# Patient Record
Sex: Female | Born: 1940 | Race: White | Hispanic: No | Marital: Married | Smoking: Current every day smoker
Health system: Southern US, Community
[De-identification: ages and names within clinical notes are randomized; demographics above are authoritative.]

## PROBLEM LIST (undated history)

## (undated) DIAGNOSIS — Z8673 Personal history of transient ischemic attack (TIA), and cerebral infarction without residual deficits: Secondary | ICD-10-CM

## (undated) DIAGNOSIS — I714 Abdominal aortic aneurysm, without rupture, unspecified: Secondary | ICD-10-CM

## (undated) DIAGNOSIS — J449 Chronic obstructive pulmonary disease, unspecified: Secondary | ICD-10-CM

## (undated) DIAGNOSIS — I639 Cerebral infarction, unspecified: Secondary | ICD-10-CM

## (undated) DIAGNOSIS — C50911 Malignant neoplasm of unspecified site of right female breast: Secondary | ICD-10-CM

## (undated) DIAGNOSIS — R350 Frequency of micturition: Secondary | ICD-10-CM

## (undated) DIAGNOSIS — R2689 Other abnormalities of gait and mobility: Secondary | ICD-10-CM

## (undated) DIAGNOSIS — R319 Hematuria, unspecified: Secondary | ICD-10-CM

## (undated) DIAGNOSIS — K08109 Complete loss of teeth, unspecified cause, unspecified class: Secondary | ICD-10-CM

## (undated) DIAGNOSIS — Z923 Personal history of irradiation: Secondary | ICD-10-CM

## (undated) DIAGNOSIS — Z170 Estrogen receptor positive status [ER+]: Secondary | ICD-10-CM

## (undated) DIAGNOSIS — R0600 Dyspnea, unspecified: Secondary | ICD-10-CM

## (undated) DIAGNOSIS — D494 Neoplasm of unspecified behavior of bladder: Secondary | ICD-10-CM

## (undated) DIAGNOSIS — Z973 Presence of spectacles and contact lenses: Secondary | ICD-10-CM

## (undated) DIAGNOSIS — I10 Essential (primary) hypertension: Secondary | ICD-10-CM

## (undated) DIAGNOSIS — Z972 Presence of dental prosthetic device (complete) (partial): Secondary | ICD-10-CM

## (undated) DIAGNOSIS — E785 Hyperlipidemia, unspecified: Secondary | ICD-10-CM

## (undated) DIAGNOSIS — J432 Centrilobular emphysema: Secondary | ICD-10-CM

## (undated) HISTORY — PX: ABDOMINAL HYSTERECTOMY: SHX81

## (undated) HISTORY — PX: SHOULDER OPEN ROTATOR CUFF REPAIR: SHX2407

## (undated) HISTORY — PX: APPENDECTOMY: SHX54

## (undated) HISTORY — PX: TONSILLECTOMY: SUR1361

## (undated) HISTORY — DX: Chronic obstructive pulmonary disease, unspecified: J44.9

## (undated) HISTORY — PX: BREAST LUMPECTOMY WITH NEEDLE LOCALIZATION AND AXILLARY LYMPH NODE DISSECTION: SHX5758

---

## 2002-01-24 ENCOUNTER — Emergency Department (HOSPITAL_COMMUNITY): Admission: EM | Admit: 2002-01-24 | Discharge: 2002-01-24 | Payer: Self-pay | Admitting: Emergency Medicine

## 2004-05-23 ENCOUNTER — Emergency Department (HOSPITAL_COMMUNITY): Admission: EM | Admit: 2004-05-23 | Discharge: 2004-05-23 | Payer: Self-pay | Admitting: *Deleted

## 2005-08-12 ENCOUNTER — Encounter: Admission: RE | Admit: 2005-08-12 | Discharge: 2005-08-12 | Payer: Self-pay | Admitting: Internal Medicine

## 2005-08-16 ENCOUNTER — Encounter: Admission: RE | Admit: 2005-08-16 | Discharge: 2005-08-16 | Payer: Self-pay | Admitting: Internal Medicine

## 2006-04-05 HISTORY — PX: BREAST LUMPECTOMY: SHX2

## 2006-08-25 ENCOUNTER — Encounter: Admission: RE | Admit: 2006-08-25 | Discharge: 2006-08-25 | Payer: Self-pay | Admitting: Internal Medicine

## 2006-09-08 ENCOUNTER — Encounter (INDEPENDENT_AMBULATORY_CARE_PROVIDER_SITE_OTHER): Payer: Self-pay | Admitting: Diagnostic Radiology

## 2006-09-08 ENCOUNTER — Encounter: Admission: RE | Admit: 2006-09-08 | Discharge: 2006-09-08 | Payer: Self-pay | Admitting: Internal Medicine

## 2006-09-27 ENCOUNTER — Encounter: Admission: RE | Admit: 2006-09-27 | Discharge: 2006-09-27 | Payer: Self-pay | Admitting: Internal Medicine

## 2006-10-11 ENCOUNTER — Encounter: Admission: RE | Admit: 2006-10-11 | Discharge: 2006-10-11 | Payer: Self-pay | Admitting: Surgery

## 2006-10-12 ENCOUNTER — Encounter: Admission: RE | Admit: 2006-10-12 | Discharge: 2006-10-12 | Payer: Self-pay | Admitting: Surgery

## 2006-10-13 ENCOUNTER — Ambulatory Visit (HOSPITAL_BASED_OUTPATIENT_CLINIC_OR_DEPARTMENT_OTHER): Admission: RE | Admit: 2006-10-13 | Discharge: 2006-10-13 | Payer: Self-pay | Admitting: Surgery

## 2006-10-13 ENCOUNTER — Encounter (INDEPENDENT_AMBULATORY_CARE_PROVIDER_SITE_OTHER): Payer: Self-pay | Admitting: Surgery

## 2006-10-13 ENCOUNTER — Encounter: Admission: RE | Admit: 2006-10-13 | Discharge: 2006-10-13 | Payer: Self-pay | Admitting: Surgery

## 2006-10-24 ENCOUNTER — Ambulatory Visit: Payer: Self-pay | Admitting: Oncology

## 2006-11-09 LAB — CBC WITH DIFFERENTIAL/PLATELET
BASO%: 0.5 % (ref 0.0–2.0)
HCT: 41.8 % (ref 34.8–46.6)
MCHC: 35.2 g/dL (ref 32.0–36.0)
MONO#: 0.8 10*3/uL (ref 0.1–0.9)
RBC: 4.67 10*6/uL (ref 3.70–5.32)
RDW: 13.3 % (ref 11.3–14.5)
WBC: 10.4 10*3/uL — ABNORMAL HIGH (ref 3.9–10.0)
lymph#: 2.9 10*3/uL (ref 0.9–3.3)

## 2006-11-13 LAB — COMPREHENSIVE METABOLIC PANEL
ALT: 28 U/L (ref 0–35)
AST: 18 U/L (ref 0–37)
Albumin: 4.2 g/dL (ref 3.5–5.2)
Calcium: 9.6 mg/dL (ref 8.4–10.5)
Chloride: 101 mEq/L (ref 96–112)
Creatinine, Ser: 0.73 mg/dL (ref 0.40–1.20)
Potassium: 3.8 mEq/L (ref 3.5–5.3)
Sodium: 140 mEq/L (ref 135–145)
Total Protein: 6.9 g/dL (ref 6.0–8.3)

## 2006-11-13 LAB — VITAMIN D PNL(25-HYDRXY+1,25-DIHY)-BLD: Vit D, 25-Hydroxy: 19 ng/mL — ABNORMAL LOW (ref 20–57)

## 2006-11-17 ENCOUNTER — Ambulatory Visit: Admission: RE | Admit: 2006-11-17 | Discharge: 2007-02-15 | Payer: Self-pay | Admitting: *Deleted

## 2006-12-30 ENCOUNTER — Encounter: Admission: RE | Admit: 2006-12-30 | Discharge: 2006-12-30 | Payer: Self-pay | Admitting: Oncology

## 2007-01-02 ENCOUNTER — Ambulatory Visit (HOSPITAL_COMMUNITY): Admission: RE | Admit: 2007-01-02 | Discharge: 2007-01-02 | Payer: Self-pay | Admitting: Oncology

## 2007-01-09 ENCOUNTER — Ambulatory Visit: Payer: Self-pay | Admitting: Oncology

## 2007-01-09 LAB — LACTATE DEHYDROGENASE: LDH: 134 U/L (ref 94–250)

## 2007-01-09 LAB — CBC WITH DIFFERENTIAL/PLATELET
Basophils Absolute: 0 10*3/uL (ref 0.0–0.1)
EOS%: 3.1 % (ref 0.0–7.0)
HCT: 40.8 % (ref 34.8–46.6)
HGB: 14.4 g/dL (ref 11.6–15.9)
MCH: 31.5 pg (ref 26.0–34.0)
NEUT%: 65.8 % (ref 39.6–76.8)
lymph#: 2.1 10*3/uL (ref 0.9–3.3)

## 2007-01-09 LAB — COMPREHENSIVE METABOLIC PANEL
ALT: 16 U/L (ref 0–35)
CO2: 28 mEq/L (ref 19–32)
Calcium: 9.7 mg/dL (ref 8.4–10.5)
Chloride: 101 mEq/L (ref 96–112)
Glucose, Bld: 117 mg/dL — ABNORMAL HIGH (ref 70–99)
Sodium: 140 mEq/L (ref 135–145)
Total Bilirubin: 0.4 mg/dL (ref 0.3–1.2)
Total Protein: 7 g/dL (ref 6.0–8.3)

## 2007-02-15 ENCOUNTER — Encounter: Admission: RE | Admit: 2007-02-15 | Discharge: 2007-02-15 | Payer: Self-pay | Admitting: Internal Medicine

## 2007-02-28 ENCOUNTER — Ambulatory Visit: Payer: Self-pay | Admitting: Oncology

## 2007-03-06 LAB — CANCER ANTIGEN 27.29: CA 27.29: 15 U/mL (ref 0–39)

## 2007-03-06 LAB — CBC WITH DIFFERENTIAL/PLATELET
BASO%: 1.5 % (ref 0.0–2.0)
EOS%: 2.4 % (ref 0.0–7.0)
HCT: 41.5 % (ref 34.8–46.6)
MCH: 31.6 pg (ref 26.0–34.0)
MCHC: 35.3 g/dL (ref 32.0–36.0)
MONO#: 0.7 10*3/uL (ref 0.1–0.9)
RBC: 4.63 10*6/uL (ref 3.70–5.32)
RDW: 13.1 % (ref 11.3–14.5)
WBC: 10.2 10*3/uL — ABNORMAL HIGH (ref 3.9–10.0)
lymph#: 2.3 10*3/uL (ref 0.9–3.3)

## 2007-03-06 LAB — COMPREHENSIVE METABOLIC PANEL
ALT: 38 U/L — ABNORMAL HIGH (ref 0–35)
AST: 21 U/L (ref 0–37)
CO2: 27 mEq/L (ref 19–32)
Calcium: 10.3 mg/dL (ref 8.4–10.5)
Chloride: 98 mEq/L (ref 96–112)
Potassium: 3.9 mEq/L (ref 3.5–5.3)
Sodium: 140 mEq/L (ref 135–145)
Total Protein: 7.5 g/dL (ref 6.0–8.3)

## 2007-03-09 ENCOUNTER — Ambulatory Visit (HOSPITAL_COMMUNITY): Admission: RE | Admit: 2007-03-09 | Discharge: 2007-03-09 | Payer: Self-pay | Admitting: Oncology

## 2007-04-03 ENCOUNTER — Ambulatory Visit (HOSPITAL_COMMUNITY): Admission: RE | Admit: 2007-04-03 | Discharge: 2007-04-03 | Payer: Self-pay | Admitting: Internal Medicine

## 2007-08-29 ENCOUNTER — Encounter: Admission: RE | Admit: 2007-08-29 | Discharge: 2007-08-29 | Payer: Self-pay | Admitting: Internal Medicine

## 2007-08-31 ENCOUNTER — Ambulatory Visit: Payer: Self-pay | Admitting: Oncology

## 2007-09-04 LAB — CBC WITH DIFFERENTIAL/PLATELET
Basophils Absolute: 0.1 10*3/uL (ref 0.0–0.1)
Eosinophils Absolute: 0.1 10*3/uL (ref 0.0–0.5)
HCT: 44.1 % (ref 34.8–46.6)
HGB: 15.4 g/dL (ref 11.6–15.9)
MCV: 89.3 fL (ref 81.0–101.0)
MONO%: 6.1 % (ref 0.0–13.0)
NEUT#: 6.2 10*3/uL (ref 1.5–6.5)
Platelets: 331 10*3/uL (ref 145–400)
RDW: 13 % (ref 11.3–14.5)

## 2007-09-05 LAB — COMPREHENSIVE METABOLIC PANEL
Albumin: 4.4 g/dL (ref 3.5–5.2)
Alkaline Phosphatase: 66 U/L (ref 39–117)
BUN: 16 mg/dL (ref 6–23)
Calcium: 10.1 mg/dL (ref 8.4–10.5)
Glucose, Bld: 120 mg/dL — ABNORMAL HIGH (ref 70–99)
Potassium: 5 mEq/L (ref 3.5–5.3)

## 2007-09-05 LAB — VITAMIN D 25 HYDROXY (VIT D DEFICIENCY, FRACTURES): Vit D, 25-Hydroxy: 30 ng/mL (ref 30–89)

## 2008-02-28 ENCOUNTER — Ambulatory Visit: Payer: Self-pay | Admitting: Oncology

## 2008-03-05 LAB — CBC WITH DIFFERENTIAL/PLATELET
Basophils Absolute: 0.1 10*3/uL (ref 0.0–0.1)
Eosinophils Absolute: 0.2 10*3/uL (ref 0.0–0.5)
HCT: 39.8 % (ref 34.8–46.6)
HGB: 13.7 g/dL (ref 11.6–15.9)
MONO#: 0.6 10*3/uL (ref 0.1–0.9)
NEUT#: 5.6 10*3/uL (ref 1.5–6.5)
NEUT%: 63.3 % (ref 39.6–76.8)
RDW: 13.6 % (ref 11.3–14.5)
lymph#: 2.4 10*3/uL (ref 0.9–3.3)

## 2008-03-06 LAB — COMPREHENSIVE METABOLIC PANEL
AST: 18 U/L (ref 0–37)
Albumin: 4.2 g/dL (ref 3.5–5.2)
BUN: 16 mg/dL (ref 6–23)
CO2: 27 mEq/L (ref 19–32)
Calcium: 8.9 mg/dL (ref 8.4–10.5)
Chloride: 101 mEq/L (ref 96–112)
Creatinine, Ser: 0.6 mg/dL (ref 0.40–1.20)
Glucose, Bld: 136 mg/dL — ABNORMAL HIGH (ref 70–99)
Potassium: 3.3 mEq/L — ABNORMAL LOW (ref 3.5–5.3)

## 2008-03-06 LAB — CANCER ANTIGEN 27.29: CA 27.29: 14 U/mL (ref 0–39)

## 2008-08-23 ENCOUNTER — Ambulatory Visit: Payer: Self-pay | Admitting: Oncology

## 2008-08-27 LAB — COMPREHENSIVE METABOLIC PANEL
AST: 17 U/L (ref 0–37)
Albumin: 4.1 g/dL (ref 3.5–5.2)
Alkaline Phosphatase: 54 U/L (ref 39–117)
BUN: 12 mg/dL (ref 6–23)
Calcium: 10 mg/dL (ref 8.4–10.5)
Chloride: 99 mEq/L (ref 96–112)
Creatinine, Ser: 0.79 mg/dL (ref 0.40–1.20)
Glucose, Bld: 126 mg/dL — ABNORMAL HIGH (ref 70–99)
Potassium: 3.6 mEq/L (ref 3.5–5.3)

## 2008-08-27 LAB — CBC WITH DIFFERENTIAL/PLATELET
Basophils Absolute: 0.1 10*3/uL (ref 0.0–0.1)
EOS%: 1.3 % (ref 0.0–7.0)
Eosinophils Absolute: 0.1 10*3/uL (ref 0.0–0.5)
HCT: 44.3 % (ref 34.8–46.6)
HGB: 15.2 g/dL (ref 11.6–15.9)
MCH: 31.2 pg (ref 25.1–34.0)
MCV: 91.1 fL (ref 79.5–101.0)
MONO%: 5.3 % (ref 0.0–14.0)
NEUT#: 5.9 10*3/uL (ref 1.5–6.5)
NEUT%: 63.8 % (ref 38.4–76.8)
RDW: 13.5 % (ref 11.2–14.5)

## 2008-08-29 ENCOUNTER — Encounter: Admission: RE | Admit: 2008-08-29 | Discharge: 2008-08-29 | Payer: Self-pay | Admitting: Internal Medicine

## 2009-01-10 ENCOUNTER — Ambulatory Visit (HOSPITAL_COMMUNITY): Admission: RE | Admit: 2009-01-10 | Discharge: 2009-01-10 | Payer: Self-pay | Admitting: Oncology

## 2009-01-23 ENCOUNTER — Ambulatory Visit: Payer: Self-pay | Admitting: Oncology

## 2009-01-27 LAB — CBC WITH DIFFERENTIAL/PLATELET
BASO%: 0.3 % (ref 0.0–2.0)
EOS%: 1.7 % (ref 0.0–7.0)
HGB: 15.2 g/dL (ref 11.6–15.9)
LYMPH%: 28.8 % (ref 14.0–49.7)
MCH: 31.6 pg (ref 25.1–34.0)
MCHC: 33.9 g/dL (ref 31.5–36.0)
MCV: 93.2 fL (ref 79.5–101.0)
MONO%: 6.8 % (ref 0.0–14.0)
NEUT%: 62.4 % (ref 38.4–76.8)
RBC: 4.82 10*6/uL (ref 3.70–5.45)
WBC: 11.3 10*3/uL — ABNORMAL HIGH (ref 3.9–10.3)
lymph#: 3.2 10*3/uL (ref 0.9–3.3)

## 2009-01-27 LAB — COMPREHENSIVE METABOLIC PANEL
ALT: 21 U/L (ref 0–35)
Alkaline Phosphatase: 58 U/L (ref 39–117)
BUN: 12 mg/dL (ref 6–23)
Calcium: 9.8 mg/dL (ref 8.4–10.5)
Potassium: 4.2 mEq/L (ref 3.5–5.3)
Total Protein: 7.1 g/dL (ref 6.0–8.3)

## 2009-09-02 ENCOUNTER — Encounter: Admission: RE | Admit: 2009-09-02 | Discharge: 2009-09-02 | Payer: Self-pay | Admitting: Internal Medicine

## 2010-01-26 ENCOUNTER — Ambulatory Visit: Payer: Self-pay | Admitting: Oncology

## 2010-01-28 LAB — COMPREHENSIVE METABOLIC PANEL
Albumin: 3.8 g/dL (ref 3.5–5.2)
BUN: 12 mg/dL (ref 6–23)
Chloride: 100 mEq/L (ref 96–112)
Glucose, Bld: 102 mg/dL — ABNORMAL HIGH (ref 70–99)
Total Bilirubin: 0.5 mg/dL (ref 0.3–1.2)

## 2010-01-28 LAB — CBC WITH DIFFERENTIAL/PLATELET
BASO%: 0.7 % (ref 0.0–2.0)
Basophils Absolute: 0.1 10*3/uL (ref 0.0–0.1)
EOS%: 2 % (ref 0.0–7.0)
Eosinophils Absolute: 0.2 10*3/uL (ref 0.0–0.5)
HCT: 42.7 % (ref 34.8–46.6)
MCHC: 34.4 g/dL (ref 31.5–36.0)
MONO%: 5.5 % (ref 0.0–14.0)
NEUT#: 6.2 10*3/uL (ref 1.5–6.5)
RBC: 4.63 10*6/uL (ref 3.70–5.45)
WBC: 11 10*3/uL — ABNORMAL HIGH (ref 3.9–10.3)

## 2010-02-12 ENCOUNTER — Encounter: Admission: RE | Admit: 2010-02-12 | Discharge: 2010-02-12 | Payer: Self-pay | Admitting: Oncology

## 2010-04-25 ENCOUNTER — Other Ambulatory Visit: Payer: Self-pay | Admitting: Oncology

## 2010-04-25 DIAGNOSIS — Z9889 Other specified postprocedural states: Secondary | ICD-10-CM

## 2010-08-18 NOTE — Op Note (Signed)
NAMEANITHA, KREISER         ACCOUNT NO.:  [id number]   MEDICAL RECORD NO.:  [id number]          PATIENT TYPE:  OUT   LOCATION:  NUC                          FACILITY:  [facility]   PHYSICIAN:  Maisie Fus A. Cornett, M.D.DATE OF BIRTH:  07/19/1940   DATE OF PROCEDURE:  10/13/2006  DATE OF DISCHARGE:                               OPERATIVE REPORT   PREOPERATIVE DIAGNOSIS:  Right breast cancer.   POSTOPERATIVE DIAGNOSIS:  Right breast cancer.   PROCEDURE:  1. Right breast needle-localized lumpectomy.  2. Right sentinel lymph node mapping with methylene blue dye.   SURGEON:  Harriette Bouillon, M.D.   ASSISTANT:  OR staff.   DRAINS:  None.   SPECIMEN:  1. Right breast mass with localizing wire to pathology.  2. 3 sentinel lymph nodes from the right axilla negative by touch      prep.   INDICATIONS FOR PROCEDURE:  The patient is a 70 year old postmenopausal  female who was found to have a 1 cm mass in the right upper outer  quadrant of her breast.  Core biopsies consistent with breast cancer.  She presents today for breast conservation surgery since she would like  to conserve her breast.   DESCRIPTION OF PROCEDURE:  After undergoing right breast needle  localization by the radiologist as well as injection of technetium  sulfur colloid in the right subareolar tissue of her breast.  She was  taken back to the operating room.  After sterile prep and drape, 4 mL of  methylene blue dye were injected in a subareolar position in the right  nipple.  Massage for five minutes ensued.  NeoProbe was then used and a  hot spot was localized in the right axilla.  Incision was made over this  and dissection was carried down into the level I axillary lymph nodes.  Three sentinel nodes were identified.  All were blue and hot.  These  were sent to pathology found be negative by touch prep.  Next lumpectomy  was performed.  Incision was made around the localizing wire in the  right lateral breast.   We dissected circumferentially around the wire  until we encountered the tip of it.  A generous rim of tissue was taken  with this.  Also took an additional medial deep margin as well.  Radiographs revealed the specimen be present within the tissue removed.  The cavity then irrigated out.  I closed cavity in layers using 3-0  Vicryl.  4-0 Monocryl was then used to close skin in subcuticular  fashion.  Right axilla was inspected, found be hemostatic and closed in  two layers using 3-0 Vicryl and 4-0 Monocryl for a subcuticular stitch.  Steri-Strips and Dermabond was applied.  The patient tolerated the  procedure well was awoke and taken to recovery in satisfactory  condition.  Final counts of sponge, needle and instruments found to be  correct.      Thomas A. Cornett, M.D.  Electronically Signed     TAC/MEDQ  D:  10/13/2006  T:  10/14/2006  Job:  [id number]   cc:   Erskine Speed, M.D.

## 2010-08-21 NOTE — Consult Note (Signed)
NAME:  Debra Keller, Debra Keller         ACCOUNT NO.:  [id number]   MEDICAL RECORD NO.:  [id number]          PATIENT TYPE:  EMS   LOCATION:  [facility]JO                         FACILITY:  [facility]   PHYSICIAN:  Jefry H. Pollyann Kennedy, MD     DATE OF BIRTH:  07-Feb-1941   DATE OF CONSULTATION:  05/23/2004  DATE OF DISCHARGE:                                   CONSULTATION   HISTORY:  This is a 70 year old lady who woke up this morning with a severe  nose bleed bilaterally, anterior and posterior.  Husband called me this  morning, and I told them to come immediately to the emergency department.  She was treated by Dr. Gerilyn Pilgrim about two or three years ago for a similar  problem.   She is on no medications, has no past medical history, and she has no  allergies to medicines.  She does not take any blood thinner.   She is a smoker of an undetermined amount.   PHYSICAL EXAMINATION:  GENERAL:  She is a healthy-appearing elderly lady.  She is in no distress.  She has some blood-staining to her face and her  clothing.  HEENT:  Her nasal examination is performed.  A large amount of clot is  evacuated from both nasal cavities.  There was some fresh bleeding down the  right side of the nasopharynx.  Topical Xylocaine with Afrin was applied for  anesthetic.   Fiberoptic nasal endoscopy was performed as well and there was some fresh  blood clot in the posterior nasal cavity, but I could not identify the  source.  Her nasal cavities are rather small and narrow, and there is some  deflection to the septum causing obstruction as well.  Nothing is seen  anteriorly or up high along the septum.  This is presumably an exterior nose  bleed, and a long Merocel packing was placed after injecting Xylocaine with  epinephrine for anesthetic into the nasal septum and the inferior turbinate,  and there was no further bleeding.   She is instructed to go home and rest for a few days.  Prescription given  for amoxicillin 500 mg b.i.d.   She will follow up with [place] in the office in  five days for removal of the packing or sooner if she has additional  bleeding.  She is instructed to try to stop smoking.      JHR/MEDQ  D:  05/23/2004  T:  05/25/2004  Job:  [id number]

## 2010-09-04 ENCOUNTER — Ambulatory Visit
Admission: RE | Admit: 2010-09-04 | Discharge: 2010-09-04 | Disposition: A | Discharge status: Home / Self Care | Payer: Self-pay | Source: Ambulatory Visit | Attending: Oncology | Admitting: Oncology

## 2010-09-04 DIAGNOSIS — Z9889 Other specified postprocedural states: Secondary | ICD-10-CM

## 2011-01-19 LAB — CBC
HCT: 41.9
Hemoglobin: 14.4
RBC: 4.62
WBC: 10.5

## 2011-01-19 LAB — DIFFERENTIAL
Basophils Absolute: 0
Basophils Relative: 0
Eosinophils Absolute: 0.1
Monocytes Relative: 6
Neutro Abs: 7.2
Neutrophils Relative %: 69

## 2011-01-19 LAB — COMPREHENSIVE METABOLIC PANEL
ALT: 21
Alkaline Phosphatase: 111
BUN: 13
CO2: 33 — ABNORMAL HIGH
Chloride: 99
Glucose, Bld: 116 — ABNORMAL HIGH
Potassium: 3.5
Sodium: 140
Total Bilirubin: 0.6
Total Protein: 7.1

## 2011-01-21 ENCOUNTER — Encounter (HOSPITAL_BASED_OUTPATIENT_CLINIC_OR_DEPARTMENT_OTHER): Payer: PRIVATE HEALTH INSURANCE | Admitting: Oncology

## 2011-01-21 ENCOUNTER — Other Ambulatory Visit: Payer: Self-pay | Admitting: Oncology

## 2011-01-21 DIAGNOSIS — C50419 Malignant neoplasm of upper-outer quadrant of unspecified female breast: Secondary | ICD-10-CM

## 2011-01-21 DIAGNOSIS — Z170 Estrogen receptor positive status [ER+]: Secondary | ICD-10-CM

## 2011-01-21 LAB — COMPREHENSIVE METABOLIC PANEL
ALT: 13 U/L (ref 0–35)
AST: 14 U/L (ref 0–37)
Albumin: 4.5 g/dL (ref 3.5–5.2)
Alkaline Phosphatase: 64 U/L (ref 39–117)
Calcium: 10 mg/dL (ref 8.4–10.5)
Chloride: 98 mEq/L (ref 96–112)
Potassium: 3.7 mEq/L (ref 3.5–5.3)
Sodium: 140 mEq/L (ref 135–145)
Total Protein: 7.2 g/dL (ref 6.0–8.3)

## 2011-01-21 LAB — CBC WITH DIFFERENTIAL/PLATELET
EOS%: 1.2 % (ref 0.0–7.0)
HGB: 15.4 g/dL (ref 11.6–15.9)
MCH: 32 pg (ref 25.1–34.0)
MCV: 93 fL (ref 79.5–101.0)
MONO%: 5.4 % (ref 0.0–14.0)
NEUT#: 8.9 10*3/uL — ABNORMAL HIGH (ref 1.5–6.5)
RBC: 4.81 10*6/uL (ref 3.70–5.45)
RDW: 13.6 % (ref 11.2–14.5)
lymph#: 4 10*3/uL — ABNORMAL HIGH (ref 0.9–3.3)

## 2011-01-28 ENCOUNTER — Encounter (HOSPITAL_BASED_OUTPATIENT_CLINIC_OR_DEPARTMENT_OTHER): Payer: PRIVATE HEALTH INSURANCE | Admitting: Oncology

## 2011-01-28 DIAGNOSIS — M810 Age-related osteoporosis without current pathological fracture: Secondary | ICD-10-CM

## 2011-01-28 DIAGNOSIS — Z170 Estrogen receptor positive status [ER+]: Secondary | ICD-10-CM

## 2011-01-28 DIAGNOSIS — C50919 Malignant neoplasm of unspecified site of unspecified female breast: Secondary | ICD-10-CM

## 2011-02-03 ENCOUNTER — Other Ambulatory Visit: Payer: Self-pay | Admitting: Oncology

## 2011-02-03 ENCOUNTER — Encounter (HOSPITAL_BASED_OUTPATIENT_CLINIC_OR_DEPARTMENT_OTHER): Payer: PRIVATE HEALTH INSURANCE | Admitting: Oncology

## 2011-02-03 DIAGNOSIS — M818 Other osteoporosis without current pathological fracture: Secondary | ICD-10-CM

## 2011-02-03 LAB — BASIC METABOLIC PANEL
BUN: 18 mg/dL (ref 6–23)
Calcium: 10.1 mg/dL (ref 8.4–10.5)
Creatinine, Ser: 0.91 mg/dL (ref 0.50–1.10)
Glucose, Bld: 132 mg/dL — ABNORMAL HIGH (ref 70–99)

## 2011-07-30 ENCOUNTER — Other Ambulatory Visit: Payer: Self-pay | Admitting: Internal Medicine

## 2011-07-30 DIAGNOSIS — Z9889 Other specified postprocedural states: Secondary | ICD-10-CM

## 2011-07-30 DIAGNOSIS — Z853 Personal history of malignant neoplasm of breast: Secondary | ICD-10-CM

## 2011-09-08 ENCOUNTER — Ambulatory Visit
Admission: RE | Admit: 2011-09-08 | Discharge: 2011-09-08 | Disposition: A | Discharge status: Home / Self Care | Payer: Medicare Other | Source: Ambulatory Visit | Attending: Internal Medicine | Admitting: Internal Medicine

## 2011-09-08 DIAGNOSIS — Z9889 Other specified postprocedural states: Secondary | ICD-10-CM

## 2011-09-08 DIAGNOSIS — Z853 Personal history of malignant neoplasm of breast: Secondary | ICD-10-CM

## 2011-12-31 ENCOUNTER — Telehealth: Payer: Self-pay | Admitting: Oncology

## 2011-12-31 NOTE — Telephone Encounter (Signed)
S/w the pt and she is aware of her nov,dec 2013 appts

## 2012-02-04 ENCOUNTER — Other Ambulatory Visit: Payer: Self-pay | Admitting: *Deleted

## 2012-02-04 DIAGNOSIS — C50919 Malignant neoplasm of unspecified site of unspecified female breast: Secondary | ICD-10-CM

## 2012-02-04 MED ORDER — TAMOXIFEN CITRATE 20 MG PO TABS: 20.0000 mg | ORAL_TABLET | Freq: Every day | ORAL | Status: DC

## 2012-02-28 ENCOUNTER — Other Ambulatory Visit (HOSPITAL_BASED_OUTPATIENT_CLINIC_OR_DEPARTMENT_OTHER): Payer: Medicare Other | Admitting: Lab

## 2012-02-28 DIAGNOSIS — C50419 Malignant neoplasm of upper-outer quadrant of unspecified female breast: Secondary | ICD-10-CM

## 2012-02-28 DIAGNOSIS — E559 Vitamin D deficiency, unspecified: Secondary | ICD-10-CM

## 2012-02-28 LAB — COMPREHENSIVE METABOLIC PANEL (CC13)
ALT: 14 U/L (ref 0–55)
Albumin: 3.5 g/dL (ref 3.5–5.0)
CO2: 33 mEq/L — ABNORMAL HIGH (ref 22–29)
Potassium: 3.7 mEq/L (ref 3.5–5.1)
Sodium: 142 mEq/L (ref 136–145)
Total Bilirubin: 0.34 mg/dL (ref 0.20–1.20)
Total Protein: 6.8 g/dL (ref 6.4–8.3)

## 2012-02-28 LAB — CBC WITH DIFFERENTIAL/PLATELET
BASO%: 0.5 % (ref 0.0–2.0)
LYMPH%: 29.5 % (ref 14.0–49.7)
MCHC: 34.1 g/dL (ref 31.5–36.0)
MONO#: 0.7 10*3/uL (ref 0.1–0.9)
Platelets: 308 10*3/uL (ref 145–400)
RBC: 4.48 10*6/uL (ref 3.70–5.45)
RDW: 14.3 % (ref 11.2–14.5)
WBC: 11 10*3/uL — ABNORMAL HIGH (ref 3.9–10.3)
lymph#: 3.2 10*3/uL (ref 0.9–3.3)

## 2012-02-29 LAB — CANCER ANTIGEN 27.29: CA 27.29: 14 U/mL (ref 0–39)

## 2012-03-06 ENCOUNTER — Ambulatory Visit (HOSPITAL_BASED_OUTPATIENT_CLINIC_OR_DEPARTMENT_OTHER): Payer: Medicare Other | Admitting: Oncology

## 2012-03-06 VITALS — BP 124/74 | HR 69 | Temp 98.0°F | Resp 20 | Ht 60.5 in | Wt 132.6 lb

## 2012-03-06 DIAGNOSIS — Z170 Estrogen receptor positive status [ER+]: Secondary | ICD-10-CM

## 2012-03-06 DIAGNOSIS — C50419 Malignant neoplasm of upper-outer quadrant of unspecified female breast: Secondary | ICD-10-CM

## 2012-03-06 DIAGNOSIS — C50919 Malignant neoplasm of unspecified site of unspecified female breast: Secondary | ICD-10-CM

## 2012-03-06 NOTE — Progress Notes (Signed)
ID: Phillips Grout   DOB: 1940-07-08  MR#: [id number]  OZH#:[id number]  PCP: Enrique Sack, MD GYN:  SU:  OTHER MD:   HISTORY OF PRESENT ILLNESS: Debra Keller is a 71 year-old [place] woman formerly seen by Dr. Donnie Coffin but establishing herself in my practice December 2008.  Her breast cancer history is summarized below, but basically she had her biopsy for a T1b lobular breast cancer July of 2008, lymph node negative, ER positive, and completed radiation late October at which time she started tamoxifen., with good tolerance.  INTERVAL HISTORY: Debra Keller returns today with her husband Debra Keller for followup of her breast cancer. The interval history is unremarkable. They enjoyed Thanksgiving is at a friend's and are looking forward to the Christmas holidays.  REVIEW OF SYSTEMS: She has tolerated tamoxifen with no side effects that she is aware of, other than mild hot flashes. Asked what her worse problem is she really does not have a worse problem. A detailed review of systems today was entirely stable.  PAST MEDICAL HISTORY: No past medical history on file. Includes possible TIAs, hypertension, history of tobacco abuse; the patient discontinuing that in June of 2007, history of emphysema by CT scan, history of hysterectomy with unilateral salpingo-oophorectomy, history of appendectomy, history of tonsillectomy, history of left shoulder release surgery.  PAST SURGICAL HISTORY: No past surgical history on file.  FAMILY HISTORY No family history on file. There is no history of breast or ovarian cancer in the family.  There is a strong history of coronary artery disease.  GYNECOLOGIC HISTORY: G1, P2.    SOCIAL HISTORY: She walks with a cane, her husband Debra Keller is here with her.  They have two daughters, and five grandchildren.  The grandchildren live in [place] and [place].   ADVANCED DIRECTIVES:  HEALTH MAINTENANCE: History  Substance Use Topics  . Smoking status: Not  on file  . Smokeless tobacco: Not on file  . Alcohol Use: Not on file     Colonoscopy:  PAP:  Bone density:  Lipid panel:  Allergies  Allergen Reactions  . Penicillins Nausea Only  . Morphine And Related Nausea Only    Current Outpatient Prescriptions  Medication Sig Dispense Refill  . Fluticasone-Salmeterol (ADVAIR) 250-50 MCG/DOSE AEPB Inhale 1 puff into the lungs every 12 (twelve) hours.      . hydrochlorothiazide (HYDRODIURIL) 25 MG tablet       . metoprolol (LOPRESSOR) 100 MG tablet       . simvastatin (ZOCOR) 20 MG tablet       . tamoxifen (NOLVADEX) 20 MG tablet Take 1 tablet (20 mg total) by mouth daily.  90 tablet  0    OBJECTIVE: Middle-aged white woman in no acute distress  Filed Vitals:   03/06/12 1137  BP: 124/74  Pulse: 69  Temp: 98 F (36.7 C)  Resp: 20     Body mass index is 25.47 kg/(m^2).    ECOG FS: 1  Sclerae unicteric Oropharynx clear No cervical or supraclavicular adenopathy Lungs no rales or rhonchi Heart regular rate and rhythm Abd benign MSK no focal spinal tenderness Neuro: nonfocal Breasts: Right breast is status post lumpectomy and radiation. There is no evidence of local recurrence. The right axilla is unremarkable. Left breast is benign.   LAB RESULTS: Lab Results  Component Value Date   WBC 11.0* 02/28/2012   NEUTROABS 6.8* 02/28/2012   HGB 14.1 02/28/2012   HCT 41.5 02/28/2012   MCV 92.5 02/28/2012   PLT 308 02/28/2012  Chemistry      Component Value Date/Time   NA 142 02/28/2012 1352   NA 140 02/03/2011 1425   K 3.7 02/28/2012 1352   K 3.9 02/03/2011 1425   CL 98 02/28/2012 1352   CL 99 02/03/2011 1425   CO2 33* 02/28/2012 1352   CO2 28 02/03/2011 1425   BUN 16.0 02/28/2012 1352   BUN 18 02/03/2011 1425   CREATININE 0.8 02/28/2012 1352   CREATININE 0.91 02/03/2011 1425      Component Value Date/Time   CALCIUM 9.8 02/28/2012 1352   CALCIUM 10.1 02/03/2011 1425   ALKPHOS 75 02/28/2012 1352   ALKPHOS 64  01/21/2011 1336   ALKPHOS 64 01/21/2011 1336   AST 14 02/28/2012 1352   AST 14 01/21/2011 1336   AST 14 01/21/2011 1336   ALT 14 02/28/2012 1352   ALT 13 01/21/2011 1336   ALT 13 01/21/2011 1336   BILITOT 0.34 02/28/2012 1352   BILITOT 0.4 01/21/2011 1336   BILITOT 0.4 01/21/2011 1336       Lab Results  Component Value Date   LABCA2 14 02/28/2012    No components found with this basename: [id number]    No results found for this basename: INR:1;PROTIME:1 in the last 168 hours  Urinalysis No results found for this basename: colorurine, appearanceur, labspec, phurine, glucoseu, hgbur, bilirubinur, ketonesur, proteinur, urobilinogen, nitrite, leukocytesur    STUDIES: Mammography 09/08/2011 was unremarkable. Bone density 02/12/2010 showed osteopenia  ASSESSMENT: 71 y.o. [place] woman status post right lumpectomy and sentinel lymph node biopsy July of 2008 for a T1b N0, estrogen receptor positive, progesterone receptor and HER-2 negative invasive lobular breast cancer (stage IA), grade 1, status post radiation, on tamoxifen since November of 2008 with excellent tolerance  PLAN: We went over the recent data that shows continuing tamoxifen for total of 10 years can reduce the risk of recurrence another 2-3%. I tend to use that data in patients who are at higher risk of recurrence namely lymph node positive patients or patients with large primary tumors. In her case, her prognosis is good enough that I am comfortable stopping at this point. She is very much in agreement with this decision.  Accordingly I am not making her any further return appointments here, though of course I will be glad to see her again in the future if the need arises.   MAGRINAT,GUSTAV C    03/06/2012

## 2012-03-07 ENCOUNTER — Telehealth: Payer: Self-pay | Admitting: Oncology

## 2012-03-07 NOTE — Telephone Encounter (Signed)
Letter sent to Dr.Edwin Green office from Dr. Darnelle Catalan

## 2012-08-10 ENCOUNTER — Other Ambulatory Visit: Payer: Self-pay | Admitting: Internal Medicine

## 2012-08-10 DIAGNOSIS — Z853 Personal history of malignant neoplasm of breast: Secondary | ICD-10-CM

## 2012-09-08 ENCOUNTER — Other Ambulatory Visit: Payer: Self-pay | Admitting: Internal Medicine

## 2012-09-08 ENCOUNTER — Ambulatory Visit
Admission: RE | Admit: 2012-09-08 | Discharge: 2012-09-08 | Disposition: A | Discharge status: Home / Self Care | Payer: Medicare Other | Source: Ambulatory Visit | Attending: Internal Medicine | Admitting: Internal Medicine

## 2012-09-08 DIAGNOSIS — Z853 Personal history of malignant neoplasm of breast: Secondary | ICD-10-CM

## 2012-09-14 ENCOUNTER — Ambulatory Visit
Admission: RE | Admit: 2012-09-14 | Discharge: 2012-09-14 | Disposition: A | Discharge status: Home / Self Care | Payer: Medicare Other | Source: Ambulatory Visit | Attending: Internal Medicine | Admitting: Internal Medicine

## 2012-09-14 DIAGNOSIS — Z853 Personal history of malignant neoplasm of breast: Secondary | ICD-10-CM

## 2013-08-21 ENCOUNTER — Other Ambulatory Visit: Payer: Self-pay | Admitting: Internal Medicine

## 2013-08-21 DIAGNOSIS — Z853 Personal history of malignant neoplasm of breast: Secondary | ICD-10-CM

## 2013-09-05 ENCOUNTER — Other Ambulatory Visit: Payer: Self-pay | Admitting: Internal Medicine

## 2013-09-05 DIAGNOSIS — M810 Age-related osteoporosis without current pathological fracture: Secondary | ICD-10-CM

## 2013-09-13 ENCOUNTER — Ambulatory Visit
Admission: RE | Admit: 2013-09-13 | Discharge: 2013-09-13 | Disposition: A | Discharge status: Home / Self Care | Payer: Medicare Other | Source: Ambulatory Visit | Attending: Internal Medicine | Admitting: Internal Medicine

## 2013-09-13 ENCOUNTER — Other Ambulatory Visit: Payer: Self-pay | Admitting: Internal Medicine

## 2013-09-13 DIAGNOSIS — Z853 Personal history of malignant neoplasm of breast: Secondary | ICD-10-CM

## 2013-09-13 DIAGNOSIS — M810 Age-related osteoporosis without current pathological fracture: Secondary | ICD-10-CM

## 2014-06-27 DIAGNOSIS — H2513 Age-related nuclear cataract, bilateral: Secondary | ICD-10-CM | POA: Diagnosis not present

## 2014-09-10 DIAGNOSIS — Z23 Encounter for immunization: Secondary | ICD-10-CM | POA: Diagnosis not present

## 2014-09-10 DIAGNOSIS — J449 Chronic obstructive pulmonary disease, unspecified: Secondary | ICD-10-CM | POA: Diagnosis not present

## 2014-09-10 DIAGNOSIS — Z0000 Encounter for general adult medical examination without abnormal findings: Secondary | ICD-10-CM | POA: Diagnosis not present

## 2014-09-10 DIAGNOSIS — I10 Essential (primary) hypertension: Secondary | ICD-10-CM | POA: Diagnosis not present

## 2014-09-10 DIAGNOSIS — D559 Anemia due to enzyme disorder, unspecified: Secondary | ICD-10-CM | POA: Diagnosis not present

## 2014-09-10 DIAGNOSIS — E780 Pure hypercholesterolemia: Secondary | ICD-10-CM | POA: Diagnosis not present

## 2015-03-12 ENCOUNTER — Other Ambulatory Visit: Payer: Self-pay | Admitting: Internal Medicine

## 2015-03-12 DIAGNOSIS — I10 Essential (primary) hypertension: Secondary | ICD-10-CM | POA: Diagnosis not present

## 2015-03-12 DIAGNOSIS — Z23 Encounter for immunization: Secondary | ICD-10-CM | POA: Diagnosis not present

## 2015-03-12 DIAGNOSIS — J449 Chronic obstructive pulmonary disease, unspecified: Secondary | ICD-10-CM | POA: Diagnosis not present

## 2015-03-12 DIAGNOSIS — Z853 Personal history of malignant neoplasm of breast: Secondary | ICD-10-CM

## 2015-03-12 DIAGNOSIS — C50919 Malignant neoplasm of unspecified site of unspecified female breast: Secondary | ICD-10-CM | POA: Diagnosis not present

## 2015-03-19 ENCOUNTER — Ambulatory Visit
Admission: RE | Admit: 2015-03-19 | Discharge: 2015-03-19 | Disposition: A | Discharge status: Home / Self Care | Payer: Medicare Other | Source: Ambulatory Visit | Attending: Internal Medicine | Admitting: Internal Medicine

## 2015-03-19 DIAGNOSIS — Z853 Personal history of malignant neoplasm of breast: Secondary | ICD-10-CM

## 2015-03-19 DIAGNOSIS — R928 Other abnormal and inconclusive findings on diagnostic imaging of breast: Secondary | ICD-10-CM | POA: Diagnosis not present

## 2015-04-03 DIAGNOSIS — I10 Essential (primary) hypertension: Secondary | ICD-10-CM | POA: Diagnosis not present

## 2015-09-03 DIAGNOSIS — H903 Sensorineural hearing loss, bilateral: Secondary | ICD-10-CM | POA: Diagnosis not present

## 2015-09-03 DIAGNOSIS — H9313 Tinnitus, bilateral: Secondary | ICD-10-CM | POA: Diagnosis not present

## 2015-09-11 DIAGNOSIS — I639 Cerebral infarction, unspecified: Secondary | ICD-10-CM | POA: Diagnosis not present

## 2015-09-11 DIAGNOSIS — D559 Anemia due to enzyme disorder, unspecified: Secondary | ICD-10-CM | POA: Diagnosis not present

## 2015-09-11 DIAGNOSIS — E785 Hyperlipidemia, unspecified: Secondary | ICD-10-CM | POA: Diagnosis not present

## 2015-09-11 DIAGNOSIS — Z0000 Encounter for general adult medical examination without abnormal findings: Secondary | ICD-10-CM | POA: Diagnosis not present

## 2015-09-13 ENCOUNTER — Other Ambulatory Visit: Payer: Self-pay | Admitting: Internal Medicine

## 2015-09-13 ENCOUNTER — Other Ambulatory Visit: Payer: Self-pay

## 2015-09-13 DIAGNOSIS — M8580 Other specified disorders of bone density and structure, unspecified site: Secondary | ICD-10-CM

## 2015-09-19 DIAGNOSIS — H2513 Age-related nuclear cataract, bilateral: Secondary | ICD-10-CM | POA: Diagnosis not present

## 2015-09-30 ENCOUNTER — Ambulatory Visit
Admission: RE | Admit: 2015-09-30 | Discharge: 2015-09-30 | Disposition: A | Discharge status: Home / Self Care | Payer: Medicare Other | Source: Ambulatory Visit | Attending: Internal Medicine | Admitting: Internal Medicine

## 2015-09-30 DIAGNOSIS — Z780 Asymptomatic menopausal state: Secondary | ICD-10-CM | POA: Diagnosis not present

## 2015-09-30 DIAGNOSIS — M8580 Other specified disorders of bone density and structure, unspecified site: Secondary | ICD-10-CM

## 2015-09-30 DIAGNOSIS — M8589 Other specified disorders of bone density and structure, multiple sites: Secondary | ICD-10-CM | POA: Diagnosis not present

## 2016-01-18 ENCOUNTER — Encounter (HOSPITAL_COMMUNITY): Payer: Self-pay

## 2016-01-18 ENCOUNTER — Emergency Department (HOSPITAL_COMMUNITY)
Admission: EM | Admit: 2016-01-18 | Discharge: 2016-01-18 | Disposition: A | Discharge status: Home / Self Care | Payer: Medicare Other | Attending: Emergency Medicine | Admitting: Emergency Medicine

## 2016-01-18 DIAGNOSIS — Z853 Personal history of malignant neoplasm of breast: Secondary | ICD-10-CM | POA: Diagnosis not present

## 2016-01-18 DIAGNOSIS — F17200 Nicotine dependence, unspecified, uncomplicated: Secondary | ICD-10-CM | POA: Diagnosis not present

## 2016-01-18 DIAGNOSIS — R040 Epistaxis: Secondary | ICD-10-CM | POA: Insufficient documentation

## 2016-01-18 DIAGNOSIS — Z7982 Long term (current) use of aspirin: Secondary | ICD-10-CM | POA: Diagnosis not present

## 2016-01-18 DIAGNOSIS — I10 Essential (primary) hypertension: Secondary | ICD-10-CM | POA: Diagnosis not present

## 2016-01-18 HISTORY — DX: Essential (primary) hypertension: I10

## 2016-01-18 MED ORDER — OXYMETAZOLINE HCL 0.05 % NA SOLN
1.0000 | Freq: Once | NASAL | Status: AC
  Administered 2016-01-18: 1 via NASAL

## 2016-01-18 MED ORDER — SODIUM CHLORIDE 0.9 % IV BOLUS (SEPSIS)
1000.0000 mL | Freq: Once | INTRAVENOUS | Status: AC
  Administered 2016-01-18: 1000 mL via INTRAVENOUS

## 2016-01-18 MED ORDER — FAMOTIDINE IN NACL 20-0.9 MG/50ML-% IV SOLN
20.0000 mg | Freq: Once | INTRAVENOUS | Status: AC
  Administered 2016-01-18: 20 mg via INTRAVENOUS
  Filled 2016-01-18: qty 50

## 2016-01-18 MED ORDER — ONDANSETRON HCL 4 MG/2ML IJ SOLN
4.0000 mg | Freq: Once | INTRAMUSCULAR | Status: AC
  Administered 2016-01-18: 4 mg via INTRAVENOUS
  Filled 2016-01-18: qty 2

## 2016-01-18 NOTE — ED Provider Notes (Signed)
[facility] DEPT Provider Note   CSN: [id number] Arrival date & time: 01/18/16  [id number]     History   Chief Complaint Chief Complaint  Patient presents with  . Epistaxis    HPI Debra Keller is a 75 y.o. female.  Level V caveat for urgent need for intervention. Right-sided nosebleed since this morning. No prodromal nasal irritation or upper respiratory infection. No blood thinners.      Past Medical History:  Diagnosis Date  . Cancer ([place])    Breast  . Hypertension     There are no active problems to display for this patient.   History reviewed. No pertinent surgical history.  OB History    No data available       Home Medications    Prior to Admission medications   Medication Sig Start Date End Date Taking? Authorizing Provider  aspirin EC 81 MG tablet Take 81 mg by mouth daily.   Yes Historical Provider, MD  Fluticasone-Salmeterol (ADVAIR) 250-50 MCG/DOSE AEPB Inhale 1 puff into the lungs every 12 (twelve) hours.   Yes Historical Provider, MD  hydrochlorothiazide (HYDRODIURIL) 25 MG tablet Take 25 mg by mouth daily.  12/15/11  Yes Historical Provider, MD  metoprolol succinate (TOPROL-XL) 100 MG 24 hr tablet Take 100 mg by mouth daily. 12/16/15  Yes Historical Provider, MD  simvastatin (ZOCOR) 20 MG tablet Take 20 mg by mouth daily.  02/04/12  Yes Historical Provider, MD  tamoxifen (NOLVADEX) 20 MG tablet Take 1 tablet (20 mg total) by mouth daily. 02/04/12   Chauncey Cruel, MD    Family History History reviewed. No pertinent family history.  Social History Social History  Substance Use Topics  . Smoking status: Current Every Day Smoker    Packs/day: 0.25  . Smokeless tobacco: Never Used  . Alcohol use Yes     Comment: rare     Allergies   Penicillins and Morphine and related   Review of Systems Review of Systems  Reason unable to perform ROS: Urgent need for intervention.     Physical Exam Updated Vital Signs BP 171/84   Pulse  77   Temp 98.3 F (36.8 C) (Oral)   Resp 18   SpO2 95%   Physical Exam  Constitutional: She is oriented to person, place, and time.  Alert  HENT:  Head: Normocephalic and atraumatic.  Copious bleeding from right naris  Eyes: Conjunctivae are normal.  Neck: Neck supple.  Cardiovascular: Normal rate and regular rhythm.   Pulmonary/Chest: Effort normal and breath sounds normal.  Abdominal: Soft. Bowel sounds are normal.  Musculoskeletal: Normal range of motion.  Neurological: She is alert and oriented to person, place, and time.  Skin: Skin is warm and dry.  Psychiatric: She has a normal mood and affect. Her behavior is normal.  Nursing note and vitals reviewed.    ED Treatments / Results  Labs (all labs ordered are listed, but only abnormal results are displayed) Labs Reviewed - No data to display  EKG  EKG Interpretation None       Radiology No results found.  Procedures .Epistaxis Management Date/Time: 01/18/2016 10:30 AM Performed by: Nat Christen Authorized by: Nat Christen   Consent:    Consent obtained:  Verbal   Consent given by:  Patient   Risks discussed:  Bleeding and nasal injury Anesthesia (see MAR for exact dosages):    Anesthesia method: Afrin. Procedure details:    Treatment site:  R anterior   Repair method: rhino rocket.  Treatment complexity:  Extensive   Treatment episode: initial   Post-procedure details:    Assessment:  Bleeding stopped   Patient tolerance of procedure:  Tolerated well, no immediate complications   (including critical care time)  Medications Ordered in ED Medications  oxymetazoline (AFRIN) 0.05 % nasal spray 1 spray (1 spray Each Nare Given 01/18/16 1216)  ondansetron (ZOFRAN) injection 4 mg (4 mg Intravenous Given 01/18/16 1152)  sodium chloride 0.9 % bolus 1,000 mL (1,000 mLs Intravenous New Bag/Given 01/18/16 1038)  famotidine (PEPCID) IVPB 20 mg premix (20 mg Intravenous New Bag/Given 01/18/16 1153)      Initial Impression / Assessment and Plan / ED Course  I have reviewed the triage vital signs and the nursing notes.  Pertinent labs & imaging results that were available during my care of the patient were reviewed by me and considered in my medical decision making (see chart for details).  Clinical Course    Patient presents with epistaxis. Rhino Rocket applied with good results. Follow-up with otolaryngology.  Final Clinical Impressions(s) / ED Diagnoses   Final diagnoses:  Right-sided epistaxis    New Prescriptions New Prescriptions   No medications on file     Nat Christen, MD 01/18/16 1227

## 2016-01-18 NOTE — ED Triage Notes (Signed)
Pt woke up this morning having a nose bleed. Pt holding pressure on arrival. Pt has hx of nose bleeds but has not had one recently.

## 2016-01-18 NOTE — Discharge Instructions (Signed)
Follow-up with ear nose and throat doctor in 2 days. Minimize activity. Try not to sneeze or blow your nose. You can eat or drink. Call the office tomorrow for an appointment.

## 2016-01-18 NOTE — ED Notes (Signed)
Dr Lacinda Axon at bedside, ENT  Cart at bedside,  rhino rocket in rt nare suction at bedside, pt tolerated well

## 2016-01-23 DIAGNOSIS — R040 Epistaxis: Secondary | ICD-10-CM | POA: Diagnosis not present

## 2016-02-12 ENCOUNTER — Other Ambulatory Visit: Payer: Self-pay | Admitting: Internal Medicine

## 2016-02-12 DIAGNOSIS — C50019 Malignant neoplasm of nipple and areola, unspecified female breast: Secondary | ICD-10-CM

## 2016-03-01 DIAGNOSIS — Z23 Encounter for immunization: Secondary | ICD-10-CM | POA: Diagnosis not present

## 2016-03-19 ENCOUNTER — Other Ambulatory Visit: Payer: Self-pay | Admitting: Internal Medicine

## 2016-03-19 ENCOUNTER — Ambulatory Visit
Admission: RE | Admit: 2016-03-19 | Discharge: 2016-03-19 | Disposition: A | Discharge status: Home / Self Care | Payer: Medicare Other | Source: Ambulatory Visit | Attending: Internal Medicine | Admitting: Internal Medicine

## 2016-03-19 DIAGNOSIS — C50019 Malignant neoplasm of nipple and areola, unspecified female breast: Secondary | ICD-10-CM

## 2016-03-19 DIAGNOSIS — Z1231 Encounter for screening mammogram for malignant neoplasm of breast: Secondary | ICD-10-CM | POA: Diagnosis not present

## 2017-02-07 ENCOUNTER — Other Ambulatory Visit: Payer: Self-pay | Admitting: Internal Medicine

## 2017-02-07 DIAGNOSIS — Z1231 Encounter for screening mammogram for malignant neoplasm of breast: Secondary | ICD-10-CM

## 2017-03-21 ENCOUNTER — Ambulatory Visit
Admission: RE | Admit: 2017-03-21 | Discharge: 2017-03-21 | Disposition: A | Discharge status: Home / Self Care | Payer: Medicare Other | Source: Ambulatory Visit | Attending: Internal Medicine | Admitting: Internal Medicine

## 2017-03-21 DIAGNOSIS — Z1231 Encounter for screening mammogram for malignant neoplasm of breast: Secondary | ICD-10-CM

## 2017-12-19 ENCOUNTER — Encounter (INDEPENDENT_AMBULATORY_CARE_PROVIDER_SITE_OTHER): Payer: Self-pay

## 2017-12-19 ENCOUNTER — Encounter: Payer: Self-pay | Admitting: Surgery

## 2017-12-19 ENCOUNTER — Ambulatory Visit: Payer: Medicare Other | Admitting: Surgery

## 2017-12-19 ENCOUNTER — Other Ambulatory Visit: Payer: Self-pay | Admitting: Urology

## 2017-12-19 ENCOUNTER — Other Ambulatory Visit: Payer: Self-pay

## 2017-12-19 VITALS — BP 132/74 | HR 63 | Temp 97.4°F | Resp 20 | Ht 60.0 in | Wt 134.0 lb

## 2017-12-19 DIAGNOSIS — I714 Abdominal aortic aneurysm, without rupture, unspecified: Secondary | ICD-10-CM

## 2017-12-19 NOTE — Progress Notes (Signed)
[facility]  Patient name: Debra Keller MRN: [id number] DOB: 08/10/1940 Sex: female   REQUESTING PROVIDER:    Dr. Gloriann Loan   REASON FOR CONSULT:    AAA  HISTORY OF PRESENT ILLNESS:   DIAMON Keller is a 77 y.o. female, who is referred today for evaluation of an abdominal aortic aneurysm that was found when she had a CT scan done for a urologic issue.  The patient has a history of TIAs in the past.  She is also been treated for breast cancer.  She is medically managed for hypertension.  She has a history of tobacco abuse.  She has emphysema by CT scan.  She takes a statin for hypercholesterolemia.  PAST MEDICAL HISTORY    Past Medical History:  Diagnosis Date  . Breast cancer ([place]) 1985   right breast  . Cancer ([place])    Breast  . Hypertension      FAMILY HISTORY   Positive for coronary artery disease  SOCIAL HISTORY:   Social History   Socioeconomic History  . Marital status: Married    Spouse name: Not on file  . Number of children: Not on file  . Years of education: Not on file  . Highest education level: Not on file  Occupational History  . Not on file  Social Needs  . Financial resource strain: Not on file  . Food insecurity:    Worry: Not on file    Inability: Not on file  . Transportation needs:    Medical: Not on file    Non-medical: Not on file  Tobacco Use  . Smoking status: Current Every Day Smoker    Packs/day: 0.25  . Smokeless tobacco: Never Used  . Tobacco comment: 1 pk lasts 2-3 days  Substance and Sexual Activity  . Alcohol use: Yes    Comment: rare  . Drug use: Not on file  . Sexual activity: Not on file  Lifestyle  . Physical activity:    Days per week: Not on file    Minutes per session: Not on file  . Stress: Not on file  Relationships  . Social connections:    Talks on phone: Not on file    Gets together: Not on file    Attends religious  service: Not on file    Active member of club or organization: Not on file    Attends meetings of clubs or organizations: Not on file    Relationship status: Not on file  . Intimate partner violence:    Fear of current or ex partner: Not on file    Emotionally abused: Not on file    Physically abused: Not on file    Forced sexual activity: Not on file  Other Topics Concern  . Not on file  Social History Narrative  . Not on file    ALLERGIES:    Allergies  Allergen Reactions  . Penicillins Nausea Only  . Morphine And Related Nausea Only    CURRENT MEDICATIONS:    Current Outpatient Medications  Medication Sig Dispense Refill  . aspirin EC 81 MG tablet Take 81 mg by mouth daily.    . Fluticasone-Salmeterol (ADVAIR) 250-50 MCG/DOSE AEPB Inhale 1 puff into the lungs every 12 (twelve) hours.    . hydrochlorothiazide (HYDRODIURIL) 25 MG tablet Take 25 mg by mouth daily.     . metoprolol succinate (TOPROL-XL) 100 MG 24 hr tablet Take 100 mg by mouth daily.    Marland Kitchen  simvastatin (ZOCOR) 20 MG tablet Take 20 mg by mouth daily.     . tamoxifen (NOLVADEX) 20 MG tablet Take 1 tablet (20 mg total) by mouth daily. 90 tablet 0   No current facility-administered medications for this visit.     REVIEW OF SYSTEMS:   [X]  denotes positive finding, [ ]  denotes negative finding Cardiac  Comments:  Chest pain or chest pressure:    Shortness of breath upon exertion:    Short of breath when lying flat:    Irregular heart rhythm:        Vascular    Pain in calf, thigh, or hip brought on by ambulation:    Pain in feet at night that wakes you up from your sleep:     Blood clot in your veins:    Leg swelling:         Pulmonary    Oxygen at home:    Productive cough:     Wheezing:         Neurologic    Sudden weakness in arms or legs:     Sudden numbness in arms or legs:     Sudden onset of difficulty speaking or slurred speech:    Temporary loss of vision in one eye:     Problems with  dizziness:         Gastrointestinal    Blood in stool:      Vomited blood:         Genitourinary    Burning when urinating:     Blood in urine:        Psychiatric    Major depression:         Hematologic    Bleeding problems:    Problems with blood clotting too easily:        Skin    Rashes or ulcers:        Constitutional    Fever or chills:     PHYSICAL EXAM:   Vitals:   12/19/17 0949  BP: 132/74  Pulse: 63  Resp: 20  Temp: (!) 97.4 F (36.3 C)  TempSrc: Oral  SpO2: 91%  Weight: 134 lb (60.8 kg)  Height: 5' (1.524 m)    GENERAL: The patient is a well-nourished female, in no acute distress. The vital signs are documented above. CARDIAC: There is a regular rate and rhythm.  VASCULAR: No carotid bruits.  Palpable pedal pulses PULMONARY: Nonlabored respirations ABDOMEN: Soft and non-tender with normal pitched bowel sounds.  No pulsatile mass MUSCULOSKELETAL: There are no major deformities or cyanosis. NEUROLOGIC: No focal weakness or paresthesias are detected. SKIN: There are no ulcers or rashes noted. PSYCHIATRIC: The patient has a normal affect.  STUDIES:   I have reviewed her outside CT scan.  There is no radiology report attached to it.  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  History of TIA: Patient will have surveillance carotid Doppler studies in 1 year.   Annamarie Major, MD [facility] [phone] Pager [phone]

## 2017-12-29 ENCOUNTER — Encounter (HOSPITAL_BASED_OUTPATIENT_CLINIC_OR_DEPARTMENT_OTHER): Payer: Self-pay | Admitting: *Deleted

## 2017-12-29 ENCOUNTER — Other Ambulatory Visit: Payer: Self-pay

## 2017-12-29 NOTE — Progress Notes (Addendum)
Spoke w/ pt and pt's husband, george, via phone for pre-op interview.  Npo after mn w/ exception clear liquids until 0815 (no cream/ milk products).  Arrive at [facility].  Needs istat 8 and ekg.  Will take toprol and do advair inhaler am dos w/ sips of water.

## 2018-01-01 NOTE — H&P (Signed)
CC: I have blood in my urine.  HPI: Debra Keller is a 77 year-old female established patient who is here for blood in the urine.  She did see the blood in her urine. She first noticed the symptoms approximately 05/06/2017. She has not seen blood clots.   She does not have a burning sensation when she urinates. She is not currently having trouble urinating.   She is not having pain. She has not recently had unwanted weight loss.   11/24/2017:  Patient has had gross hematuria, intermittently pink-colored for the past 6 months. She states she has had a workup back in 2005 that was negative. She denies any other associated symptoms including dysuria. She does have some baseline urinary dysfunction including frequency, mild nocturia, mild urge urinary incontinence, intermittency. She is not too bothered by this. She is a smoker of 55 years.   12/13/2017:  Patient finished a CT IVP on 12/07/2017. This revealed a 2 cm enhancing mass on the right side of the bladder. There was no adenopathy. No concern for metastatic disease. 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 She has no complaints today. She presents for cystoscopy.      ALLERGIES: codeine penicillin     MEDICATIONS: Hydrochlorothiazide 25 mg tablet  Metoprolol Tartrate 100 mg tablet  Simvastatin  Advair Hfa  Aspir 81     GU PSH: Locm 300-399Mg /Ml Iodine,1Ml - 12/07/2017    NON-GU PSH: Breast lumpectomy, Right Hysterectomy        GU PMH: Gross hematuria - 11/24/2017 Nocturia - 11/24/2017 Urinary Frequency - 11/24/2017    NON-GU PMH: Breast Cancer, History COPD GERD Hypercholesterolemia Hypertension Stroke/TIA    FAMILY HISTORY: 1 Daughter - Other Heart Attack - Father, Mother heart failure - Father   SOCIAL HISTORY: Marital Status: Married Preferred Language: English; Race: White Current Smoking Status: Patient smokes.  <DIV'  Tobacco  Use Assessment Completed:  Used Tobacco in last 30 days?   Drinks 4+ caffeinated drinks per day.    REVIEW OF SYSTEMS:     GU Review Female:  Patient reports frequent urination. Patient denies hard to postpone urination, burning /pain with urination, get up at night to urinate, leakage of urine, stream starts and stops, trouble starting your stream, have to strain to urinate, and being pregnant.    Gastrointestinal (Upper):  Patient denies nausea, vomiting, and indigestion/ heartburn.    Gastrointestinal (Lower):  Patient denies diarrhea and constipation.    Constitutional:  Patient denies fever, night sweats, weight loss, and fatigue.    Skin:  Patient denies skin rash/ lesion and itching.    Eyes:  Patient denies blurred vision and double vision.    Ears/ Nose/ Throat:  Patient denies sore throat and sinus problems.    Hematologic/Lymphatic:  Patient denies swollen glands and easy bruising.    Cardiovascular:  Patient denies leg swelling and chest pains.    Respiratory:  Patient denies cough and shortness of breath.    Endocrine:  Patient denies excessive thirst.    Musculoskeletal:  Patient denies back pain and joint pain.    Neurological:  Patient denies dizziness and headaches.    Psychologic:  Patient denies depression and anxiety.    VITAL SIGNS:       12/13/2017 01:38 PM     Weight 135 lb / 61.23 kg     Height 60 in / 152.4 cm  BP 110/73 mmHg     Heart Rate 64 /min     Temperature 98.3 F / 36.8 C     BMI 26.4 kg/m     MULTI-SYSTEM PHYSICAL EXAMINATION:      Constitutional: Well-nourished. No physical deformities. Normally developed. Good grooming.     Respiratory: No labored breathing, no use of accessory muscles.      Cardiovascular: Normal temperature, adequate perfusion of extremities     Skin: No paleness, no jaundice     Neurologic / Psychiatric: Oriented to time, oriented to place, oriented to person. No depression, no anxiety, no agitation.     Gastrointestinal:  No mass, no tenderness, no rigidity, non obese abdomen. No CVA tenderness bilaterally     Eyes: Normal conjunctivae. Normal eyelids.     Musculoskeletal: Normal gait and station of head and neck.            PAST DATA REVIEWED:   Source Of History:  Patient   PROCEDURES:    Flexible Cystoscopy - 52000  Risks, benefits, and some of the potential complications of the procedure were discussed at length with the patient including infection, bleeding, voiding discomfort, urinary retention, fever, chills, sepsis, and others. All questions were answered. Informed consent was obtained. Antibiotic prophylaxis was given. Sterile technique and intraurethral analgesia were used.  Meatus:  Normal size. Normal location. Normal condition.  Urethra:  Normal urethra  Ureteral Orifices:  Normal location. Normal size. Normal shape.   Bladder:  No trabeculation. 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 The procedure was well-tolerated and without complications. Antibiotic instructions were given. Instructions were given to call the office immediately for bloody urine, difficulty urinating, urinary retention, painful or frequent urination, fever, chills, nausea, vomiting or other illness. The patient stated that she understood these instructions and would comply with them.    Urinalysis w/Scope  Dipstick Dipstick Cont'd Micro  Color: Yellow Bilirubin: Neg mg/dL WBC/hpf: 6 - 10/hpf  Appearance: Cloudy Ketones: Neg mg/dL RBC/hpf: 20 - 40/hpf  Specific Gravity: 1.020 Blood: 3+ ery/uL Bacteria: Mod (26-50/hpf)  pH: <=5.0 Protein: Neg mg/dL Cystals: NS (Not Seen)  Glucose: Neg mg/dL Urobilinogen: 0.2 mg/dL Casts: NS (Not Seen)   Nitrites: Neg Trichomonas: Not Present   Leukocyte Esterase: Neg leu/uL Mucous: Not Present    Epithelial Cells: 10 - 20/hpf    Yeast: NS (Not Seen)    Sperm: Not Present    ASSESSMENT:     ICD-10 Details  1  GU:  Gross hematuria - R31.0    PLAN:   Orders  Labs Urine Culture  Schedule  Return Visit/Planned Activity: ASAP - Consult To Prof. 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 She understands potential risks including but not limited to bleeding, infection, injury to surrounding structures including possible bladder perforation, possible need for additional procedures. We will consider intravesical installation of chemotherapy at the time with gemcitabine.   Cc: [name], M.D.   Signed by Link Snuffer, III, M.D. on 12/13/17 at 2:20 PM (EDT

## 2018-01-02 ENCOUNTER — Other Ambulatory Visit: Payer: Self-pay

## 2018-01-02 ENCOUNTER — Encounter (HOSPITAL_BASED_OUTPATIENT_CLINIC_OR_DEPARTMENT_OTHER): Payer: Self-pay

## 2018-01-02 ENCOUNTER — Ambulatory Visit (HOSPITAL_BASED_OUTPATIENT_CLINIC_OR_DEPARTMENT_OTHER): Payer: Medicare Other | Admitting: Anesthesiology

## 2018-01-02 ENCOUNTER — Encounter (HOSPITAL_BASED_OUTPATIENT_CLINIC_OR_DEPARTMENT_OTHER)
Admission: RE | Disposition: A | Discharge status: Home / Self Care | Payer: Self-pay | Source: Ambulatory Visit | Attending: Urology

## 2018-01-02 ENCOUNTER — Ambulatory Visit (HOSPITAL_BASED_OUTPATIENT_CLINIC_OR_DEPARTMENT_OTHER)
Admission: RE | Admit: 2018-01-02 | Discharge: 2018-01-02 | Disposition: A | Discharge status: Home / Self Care | Payer: Medicare Other | Source: Ambulatory Visit | Attending: Urology | Admitting: Urology

## 2018-01-02 DIAGNOSIS — Z8673 Personal history of transient ischemic attack (TIA), and cerebral infarction without residual deficits: Secondary | ICD-10-CM | POA: Diagnosis not present

## 2018-01-02 DIAGNOSIS — Z79899 Other long term (current) drug therapy: Secondary | ICD-10-CM | POA: Diagnosis not present

## 2018-01-02 DIAGNOSIS — Z8249 Family history of ischemic heart disease and other diseases of the circulatory system: Secondary | ICD-10-CM | POA: Diagnosis not present

## 2018-01-02 DIAGNOSIS — Z853 Personal history of malignant neoplasm of breast: Secondary | ICD-10-CM | POA: Diagnosis not present

## 2018-01-02 DIAGNOSIS — Z885 Allergy status to narcotic agent status: Secondary | ICD-10-CM | POA: Insufficient documentation

## 2018-01-02 DIAGNOSIS — K219 Gastro-esophageal reflux disease without esophagitis: Secondary | ICD-10-CM | POA: Insufficient documentation

## 2018-01-02 DIAGNOSIS — I714 Abdominal aortic aneurysm, without rupture: Secondary | ICD-10-CM | POA: Diagnosis not present

## 2018-01-02 DIAGNOSIS — C672 Malignant neoplasm of lateral wall of bladder: Secondary | ICD-10-CM | POA: Diagnosis not present

## 2018-01-02 DIAGNOSIS — F17200 Nicotine dependence, unspecified, uncomplicated: Secondary | ICD-10-CM | POA: Diagnosis not present

## 2018-01-02 DIAGNOSIS — E7800 Pure hypercholesterolemia, unspecified: Secondary | ICD-10-CM | POA: Insufficient documentation

## 2018-01-02 DIAGNOSIS — I10 Essential (primary) hypertension: Secondary | ICD-10-CM | POA: Diagnosis not present

## 2018-01-02 DIAGNOSIS — Z880 Allergy status to penicillin: Secondary | ICD-10-CM | POA: Diagnosis not present

## 2018-01-02 DIAGNOSIS — J449 Chronic obstructive pulmonary disease, unspecified: Secondary | ICD-10-CM | POA: Diagnosis not present

## 2018-01-02 DIAGNOSIS — Z7982 Long term (current) use of aspirin: Secondary | ICD-10-CM | POA: Insufficient documentation

## 2018-01-02 DIAGNOSIS — D494 Neoplasm of unspecified behavior of bladder: Secondary | ICD-10-CM | POA: Diagnosis present

## 2018-01-02 HISTORY — DX: Presence of dental prosthetic device (complete) (partial): Z97.2

## 2018-01-02 HISTORY — DX: Centrilobular emphysema: J43.2

## 2018-01-02 HISTORY — DX: Abdominal aortic aneurysm, without rupture, unspecified: I71.40

## 2018-01-02 HISTORY — DX: Personal history of transient ischemic attack (TIA), and cerebral infarction without residual deficits: Z86.73

## 2018-01-02 HISTORY — DX: Frequency of micturition: R35.0

## 2018-01-02 HISTORY — DX: Abdominal aortic aneurysm, without rupture: I71.4

## 2018-01-02 HISTORY — DX: Hematuria, unspecified: R31.9

## 2018-01-02 HISTORY — DX: Malignant neoplasm of unspecified site of right female breast: C50.911

## 2018-01-02 HISTORY — DX: Complete loss of teeth, unspecified cause, unspecified class: K08.109

## 2018-01-02 HISTORY — PX: TRANSURETHRAL RESECTION OF BLADDER TUMOR WITH MITOMYCIN-C: SHX6459

## 2018-01-02 HISTORY — DX: Estrogen receptor positive status (ER+): Z17.0

## 2018-01-02 HISTORY — DX: Hyperlipidemia, unspecified: E78.5

## 2018-01-02 HISTORY — DX: Other abnormalities of gait and mobility: R26.89

## 2018-01-02 HISTORY — DX: Presence of spectacles and contact lenses: Z97.3

## 2018-01-02 HISTORY — DX: Neoplasm of unspecified behavior of bladder: D49.4

## 2018-01-02 HISTORY — DX: Personal history of irradiation: Z92.3

## 2018-01-02 LAB — POCT I-STAT, CHEM 8
BUN: 17 mg/dL (ref 8–23)
Calcium, Ion: 1.19 mmol/L (ref 1.15–1.40)
Chloride: 99 mmol/L (ref 98–111)
Creatinine, Ser: 0.7 mg/dL (ref 0.44–1.00)
Glucose, Bld: 108 mg/dL — ABNORMAL HIGH (ref 70–99)
HCT: 46 % (ref 36.0–46.0)
Hemoglobin: 15.6 g/dL — ABNORMAL HIGH (ref 12.0–15.0)
Potassium: 3.2 mmol/L — ABNORMAL LOW (ref 3.5–5.1)
Sodium: 140 mmol/L (ref 135–145)
TCO2: 30 mmol/L (ref 22–32)

## 2018-01-02 SURGERY — TRANSURETHRAL RESECTION OF BLADDER TUMOR WITH MITOMYCIN-C
Anesthesia: General

## 2018-01-02 MED ORDER — DIPHENHYDRAMINE HCL 50 MG/ML IJ SOLN: INTRAMUSCULAR | Status: DC | PRN

## 2018-01-02 MED ORDER — SODIUM CHLORIDE 0.9 % IR SOLN
Status: DC | PRN
  Administered 2018-01-02 (×3): 3000 mL via INTRAVESICAL

## 2018-01-02 MED ORDER — ONDANSETRON HCL 4 MG/2ML IJ SOLN
INTRAMUSCULAR | Status: AC
  Filled 2018-01-02: qty 2

## 2018-01-02 MED ORDER — DEXAMETHASONE SODIUM PHOSPHATE 10 MG/ML IJ SOLN
INTRAMUSCULAR | Status: AC
  Filled 2018-01-02: qty 1

## 2018-01-02 MED ORDER — OXYCODONE HCL 5 MG/5ML PO SOLN
5.0000 mg | Freq: Once | ORAL | Status: DC | PRN
  Filled 2018-01-02: qty 5

## 2018-01-02 MED ORDER — ONDANSETRON HCL 4 MG/2ML IJ SOLN
4.0000 mg | Freq: Once | INTRAMUSCULAR | Status: AC | PRN
  Administered 2018-01-02: 4 mg via INTRAVENOUS
  Filled 2018-01-02: qty 2

## 2018-01-02 MED ORDER — HYDROCODONE-ACETAMINOPHEN 5-325 MG PO TABS: 1.0000 | ORAL_TABLET | ORAL | 0 refills | Status: DC | PRN

## 2018-01-02 MED ORDER — LACTATED RINGERS IV SOLN
INTRAVENOUS | Status: DC
  Administered 2018-01-02: 16:00:00 via INTRAVENOUS
  Administered 2018-01-02: 1000 mL via INTRAVENOUS
  Filled 2018-01-02: qty 1000

## 2018-01-02 MED ORDER — EPHEDRINE 5 MG/ML INJ
INTRAVENOUS | Status: AC
  Filled 2018-01-02: qty 10

## 2018-01-02 MED ORDER — PROPOFOL 10 MG/ML IV BOLUS
INTRAVENOUS | Status: DC | PRN
  Administered 2018-01-02: 120 mg via INTRAVENOUS

## 2018-01-02 MED ORDER — FENTANYL CITRATE (PF) 100 MCG/2ML IJ SOLN
INTRAMUSCULAR | Status: AC
  Filled 2018-01-02: qty 2

## 2018-01-02 MED ORDER — SUGAMMADEX SODIUM 200 MG/2ML IV SOLN
INTRAVENOUS | Status: DC | PRN
  Administered 2018-01-02: 200 mg via INTRAVENOUS

## 2018-01-02 MED ORDER — PROPOFOL 10 MG/ML IV BOLUS
INTRAVENOUS | Status: AC
  Filled 2018-01-02: qty 20

## 2018-01-02 MED ORDER — ROCURONIUM BROMIDE 10 MG/ML (PF) SYRINGE
PREFILLED_SYRINGE | INTRAVENOUS | Status: AC
  Filled 2018-01-02: qty 10

## 2018-01-02 MED ORDER — OXYCODONE HCL 5 MG PO TABS
5.0000 mg | ORAL_TABLET | Freq: Once | ORAL | Status: DC | PRN
  Filled 2018-01-02: qty 1

## 2018-01-02 MED ORDER — LIDOCAINE HCL (CARDIAC) PF 100 MG/5ML IV SOSY
PREFILLED_SYRINGE | INTRAVENOUS | Status: DC | PRN
  Administered 2018-01-02: 60 mg via INTRAVENOUS

## 2018-01-02 MED ORDER — GEMCITABINE CHEMO FOR BLADDER INSTILLATION 2000 MG
2000.0000 mg | Freq: Once | INTRAVENOUS | Status: AC
  Administered 2018-01-02: 2000 mg via INTRAVESICAL
  Filled 2018-01-02: qty 2000

## 2018-01-02 MED ORDER — CEFAZOLIN SODIUM-DEXTROSE 2-3 GM-%(50ML) IV SOLR
INTRAVENOUS | Status: DC | PRN
  Administered 2018-01-02: 2 g via INTRAVENOUS

## 2018-01-02 MED ORDER — LIDOCAINE 2% (20 MG/ML) 5 ML SYRINGE
INTRAMUSCULAR | Status: AC
  Filled 2018-01-02: qty 5

## 2018-01-02 MED ORDER — EPHEDRINE SULFATE 50 MG/ML IJ SOLN
INTRAMUSCULAR | Status: DC | PRN
  Administered 2018-01-02 (×2): 5 mg via INTRAVENOUS
  Administered 2018-01-02 (×2): 10 mg via INTRAVENOUS

## 2018-01-02 MED ORDER — FENTANYL CITRATE (PF) 100 MCG/2ML IJ SOLN
25.0000 ug | INTRAMUSCULAR | Status: DC | PRN
  Filled 2018-01-02: qty 1

## 2018-01-02 MED ORDER — FENTANYL CITRATE (PF) 100 MCG/2ML IJ SOLN
INTRAMUSCULAR | Status: DC | PRN
  Administered 2018-01-02 (×2): 50 ug via INTRAVENOUS

## 2018-01-02 MED ORDER — DIPHENHYDRAMINE HCL 50 MG/ML IJ SOLN
INTRAMUSCULAR | Status: DC | PRN
  Administered 2018-01-02: 12.5 mg via INTRAVENOUS

## 2018-01-02 MED ORDER — SUGAMMADEX SODIUM 200 MG/2ML IV SOLN
INTRAVENOUS | Status: AC
  Filled 2018-01-02: qty 2

## 2018-01-02 MED ORDER — CIPROFLOXACIN IN D5W 400 MG/200ML IV SOLN
INTRAVENOUS | Status: AC
  Filled 2018-01-02: qty 200

## 2018-01-02 MED ORDER — CIPROFLOXACIN IN D5W 400 MG/200ML IV SOLN
400.0000 mg | INTRAVENOUS | Status: AC
  Administered 2018-01-02: 10 mg via INTRAVENOUS
  Filled 2018-01-02: qty 200

## 2018-01-02 MED ORDER — DIPHENHYDRAMINE HCL 50 MG/ML IJ SOLN
INTRAMUSCULAR | Status: AC
  Filled 2018-01-02: qty 1

## 2018-01-02 MED ORDER — KETOROLAC TROMETHAMINE 30 MG/ML IJ SOLN
INTRAMUSCULAR | Status: AC
  Filled 2018-01-02: qty 1

## 2018-01-02 SURGICAL SUPPLY — 26 items
BAG DRAIN URO-CYSTO SKYTR STRL (DRAIN) ×3 IMPLANT
BAG DRN ANRFLXCHMBR STRAP LEK (BAG)
BAG DRN UROCATH (DRAIN) ×1
BAG URINE DRAINAGE (UROLOGICAL SUPPLIES) IMPLANT
BAG URINE LEG 19OZ MD ST LTX (BAG) IMPLANT
BAG URINE LEG 500ML (DRAIN) IMPLANT
CATH FOLEY 2WAY SLVR  5CC 18FR (CATHETERS) ×2
CATH FOLEY 2WAY SLVR  5CC 22FR (CATHETERS)
CATH FOLEY 2WAY SLVR 30CC 20FR (CATHETERS) IMPLANT
CATH FOLEY 2WAY SLVR 5CC 18FR (CATHETERS) IMPLANT
CATH FOLEY 2WAY SLVR 5CC 22FR (CATHETERS) IMPLANT
CLOTH BEACON ORANGE TIMEOUT ST (SAFETY) ×3 IMPLANT
ELECT REM PT RETURN 9FT ADLT (ELECTROSURGICAL) ×3
ELECTRODE REM PT RTRN 9FT ADLT (ELECTROSURGICAL) ×1 IMPLANT
EVACUATOR MICROVAS BLADDER (UROLOGICAL SUPPLIES) IMPLANT
GLOVE BIO SURGEON STRL SZ7.5 (GLOVE) ×3 IMPLANT
GOWN STRL REUS W/ TWL LRG LVL3 (GOWN DISPOSABLE) ×1 IMPLANT
GOWN STRL REUS W/TWL LRG LVL3 (GOWN DISPOSABLE) ×3
IV NS IRRIG 3000ML ARTHROMATIC (IV SOLUTION) ×3 IMPLANT
KIT TURNOVER CYSTO (KITS) ×3 IMPLANT
MANIFOLD NEPTUNE II (INSTRUMENTS) IMPLANT
PACK CYSTO (CUSTOM PROCEDURE TRAY) ×3 IMPLANT
PLUG CATH AND CAP STER (CATHETERS) ×2 IMPLANT
SYRINGE IRR TOOMEY STRL 70CC (SYRINGE) IMPLANT
TUBE CONNECTING 12'X1/4 (SUCTIONS)
TUBE CONNECTING 12X1/4 (SUCTIONS) IMPLANT

## 2018-01-02 NOTE — Transfer of Care (Signed)
Immediate Anesthesia Transfer of Care Note  Patient: Debra Keller  Procedure(s) Performed: TRANSURETHRAL RESECTION OF BLADDER TUMOR WITH GEMCITABINE (N/A )  Patient Location: PACU  Anesthesia Type:General  Level of Consciousness: awake, alert  and oriented  Airway & Oxygen Therapy: Patient Spontanous Breathing and Patient connected to nasal cannula oxygen  Post-op Assessment: Report given to RN and Post -op Vital signs reviewed and stable  Post vital signs: Reviewed and stable  Last Vitals:  Vitals Value Taken Time  BP    Temp    Pulse    Resp 18 01/02/2018  3:02 PM  SpO2    Vitals shown include unvalidated device data.  Last Pain:  Vitals:   01/02/18 1302  TempSrc:   PainSc: 0-No pain      Patients Stated Pain Goal: 5 (42/10/31 2811)  Complications: No apparent anesthesia complications

## 2018-01-02 NOTE — Anesthesia Postprocedure Evaluation (Signed)
Anesthesia Post Note  Patient: BRITNEY NEWSTROM  Procedure(s) Performed: TRANSURETHRAL RESECTION OF BLADDER TUMOR WITH GEMCITABINE (N/A )     Patient location during evaluation: PACU Anesthesia Type: General Level of consciousness: awake and alert Pain management: pain level controlled Vital Signs Assessment: post-procedure vital signs reviewed and stable Respiratory status: spontaneous breathing, nonlabored ventilation, respiratory function stable and patient connected to nasal cannula oxygen Cardiovascular status: blood pressure returned to baseline and stable Postop Assessment: no apparent nausea or vomiting Anesthetic complications: no    Last Vitals:  Vitals:   01/02/18 1732 01/02/18 1805  BP: 133/62 135/68  Pulse: 71 71  Resp:  16  Temp:  37.2 C  SpO2: 92% 92%    Last Pain:  Vitals:   01/02/18 1800  TempSrc:   PainSc: 0-No pain                 Ryan P Ellender

## 2018-01-02 NOTE — Interval H&P Note (Signed)
History and Physical Interval Note:  01/02/2018 2:15 PM  Debra Keller  has presented today for surgery, with the diagnosis of bladder tumor  The various methods of treatment have been discussed with the patient and family. After consideration of risks, benefits and other options for treatment, the patient has consented to  Procedure(s): TRANSURETHRAL RESECTION OF BLADDER TUMOR WITH GEMCITABINE (N/A) as a surgical intervention .  The patient's history has been reviewed, patient examined, no change in status, stable for surgery.  I have reviewed the patient's chart and labs.  Questions were answered to the patient's satisfaction.     Marton Redwood, III

## 2018-01-02 NOTE — Anesthesia Procedure Notes (Signed)
Procedure Name: Intubation Date/Time: 01/02/2018 2:26 PM Performed by: Jonna Munro, CRNA Pre-anesthesia Checklist: Patient identified, Emergency Drugs available, Suction available, Patient being monitored and Timeout performed Patient Re-evaluated:Patient Re-evaluated prior to induction Oxygen Delivery Method: Circle system utilized Preoxygenation: Pre-oxygenation with 100% oxygen Induction Type: IV induction Ventilation: Mask ventilation without difficulty Laryngoscope Size: Mac and 3 Grade View: Grade I Tube type: Oral Tube size: 7.0 mm Number of attempts: 1 Airway Equipment and Method: Stylet Placement Confirmation: ETT inserted through vocal cords under direct vision,  positive ETCO2 and breath sounds checked- equal and bilateral Secured at: 21 cm Tube secured with: Tape Dental Injury: Teeth and Oropharynx as per pre-operative assessment

## 2018-01-02 NOTE — Op Note (Signed)
Operative Note  Preoperative diagnosis:  1.  Bladder tumor  Postoperative diagnosis: 1.  Bladder tumor  Procedure(s): 1.  Transurethral resection of bladder tumor-medium 2.  Intravesical instillation of chemotherapy agent, gemcitabine  Surgeon: Link Snuffer, MD  Assistants: None  Anesthesia: General  Complications: None immediate  EBL: Minimal  Specimens: 1.  Bladder tumor  Drains/Catheters: 1.  Foley catheter  Intraoperative findings: 1.  Normal urethra 2.  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  Appeared to be completely resected.  Ureters were in normal position and were well away from the area of resection.  Indication: 77 year old female found to have a bladder tumor on hematuria work-up presents for the previously mentioned operation.  Description of procedure:  The patient was identified and consent was obtained.  The patient was taken to the operating room and placed in the supine position.  The patient was placed under general anesthesia.  Perioperative antibiotics were administered.  The patient was placed in dorsal lithotomy.  Patient was prepped and draped in a standard sterile fashion and a timeout was performed.  A 26 French resectoscope with the visual obturator in place was advanced into the urethra and into the bladder.  Complete cystoscopy was performed with the findings noted above.  I then exchanged this for the working element and proceeded to resect the tumor of interest on bipolar settings and fulgurated the resection bed as well as a surrounding areas.  I collected the bladder chips for specimen and reinspected the bladder mucosa.  There was no active bleeding noted.  I therefore withdrew the scope and placed a urethral catheter.  The patient tolerated the procedure well and was stable postoperatively.  Intravesical instillation of gemcitabine was performed at the bedside in the PACU.  This will remain for 2 hours after  instillation.  Go home without a catheter.  Plan: Follow-up in 1 week for pathology review

## 2018-01-02 NOTE — Discharge Instructions (Addendum)
Transurethral Resection of Bladder Tumor (TURBT) or Bladder Biopsy ° ° °Definition: ° Transurethral Resection of the Bladder Tumor is a surgical procedure used to diagnose and remove tumors within the bladder. TURBT is the most common treatment for early stage bladder cancer. ° °General instructions: °   ° Your recent bladder surgery requires very little post hospital care but some definite precautions. ° °Despite the fact that no skin incisions were used, the area around the bladder incisions are raw and covered with scabs to promote healing and prevent bleeding. Certain precautions are needed to insure that the scabs are not disturbed over the next 2-4 weeks while the healing proceeds. ° °Because the raw surface inside your bladder and the irritating effects of urine you may expect frequency of urination and/or urgency (a stronger desire to urinate) and perhaps even getting up at night more often. This will usually resolve or improve slowly over the healing period. You may see some blood in your urine over the first 6 weeks. Do not be alarmed, even if the urine was clear for a while. Get off your feet and drink lots of fluids until clearing occurs. If you start to pass clots or don't improve call us. ° °Diet: ° °You may return to your normal diet immediately. Because of the raw surface of your bladder, alcohol, spicy foods, foods high in acid and drinks with caffeine may cause irritation or frequency and should be used in moderation. To keep your urine flowing freely and avoid constipation, drink plenty of fluids during the day (8-10 glasses). Tip: Avoid cranberry juice because it is very acidic. ° °Activity: ° °Your physical activity doesn't need to be restricted. However, if you are very active, you may see some blood in the urine. We suggest that you reduce your activity under the circumstances until the bleeding has stopped. ° °Bowels: ° °It is important to keep your bowels regular during the postoperative  period. Straining with bowel movements can cause bleeding. A bowel movement every other day is reasonable. Use a mild laxative if needed, such as milk of magnesia 2-3 tablespoons, or 2 Dulcolax tablets. Call if you continue to have problems. If you had been taking narcotics for pain, before, during or after your surgery, you may be constipated. Take a laxative if necessary. ° ° ° °Medication: ° °You should resume your pre-surgery medications unless told not to. In addition you may be given an antibiotic to prevent or treat infection. Antibiotics are not always necessary. All medication should be taken as prescribed until the bottles are finished unless you are having an unusual reaction to one of the drugs. ° ° ° ° ° °Post Anesthesia Home Care Instructions ° °Activity: °Get plenty of rest for the remainder of the day. A responsible individual must stay with you for 24 hours following the procedure.  °For the next 24 hours, DO NOT: °-Drive a car °-Operate machinery °-Drink alcoholic beverages °-Take any medication unless instructed by your physician °-Make any legal decisions or sign important papers. ° °Meals: °Start with liquid foods such as gelatin or soup. Progress to regular foods as tolerated. Avoid greasy, spicy, heavy foods. If nausea and/or vomiting occur, drink only clear liquids until the nausea and/or vomiting subsides. Call your physician if vomiting continues. ° °Special Instructions/Symptoms: °Your throat may feel dry or sore from the anesthesia or the breathing tube placed in your throat during surgery. If this causes discomfort, gargle with warm salt water. The discomfort should disappear within 24   hours. ° °If you had a scopolamine patch placed behind your ear for the management of post- operative nausea and/or vomiting: ° °1. The medication in the patch is effective for 72 hours, after which it should be removed.  Wrap patch in a tissue and discard in the trash. Wash hands thoroughly with soap and  water. °2. You may remove the patch earlier than 72 hours if you experience unpleasant side effects which may include dry mouth, dizziness or visual disturbances. °3. Avoid touching the patch. Wash your hands with soap and water after contact with the patch. °   ° ° ° °

## 2018-01-02 NOTE — Anesthesia Preprocedure Evaluation (Addendum)
Anesthesia Evaluation  Patient identified by MRN, date of birth, ID band Patient awake    Reviewed: Allergy & Precautions, NPO status , Patient's Chart, lab work & pertinent test results, reviewed documented beta blocker date and time   History of Anesthesia Complications Negative for: history of anesthetic complications  Airway Mallampati: II  TM Distance: >3 FB Neck ROM: Full    Dental  (+) Upper Dentures, Edentulous Upper, Edentulous Lower   Pulmonary COPD,  COPD inhaler, Current Smoker,    breath sounds clear to auscultation (-) wheezing      Cardiovascular hypertension,  Rhythm:Regular Rate:Normal  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)   Neuro/Psych TIAnegative psych ROS   GI/Hepatic negative GI ROS, Neg liver ROS,   Endo/Other  negative endocrine ROS  Renal/GU negative Renal ROS  negative genitourinary   Musculoskeletal negative musculoskeletal ROS (+)   Abdominal   Peds  Hematology negative hematology ROS (+)   Anesthesia Other Findings   Reproductive/Obstetrics                            Anesthesia Physical Anesthesia Plan  ASA: III  Anesthesia Plan: General   Post-op Pain Management:    Induction: Intravenous  PONV Risk Score and Plan: 3 and Ondansetron, Dexamethasone and Treatment may vary due to age or medical condition  Airway Management Planned: LMA and Oral ETT  Additional Equipment:   Intra-op Plan:   Post-operative Plan: Extubation in OR  Informed Consent: I have reviewed the patients History and Physical, chart, labs and discussed the procedure including the risks, benefits and alternatives for the proposed anesthesia with the patient or authorized representative who has indicated his/her understanding and acceptance.     Plan Discussed with:   Anesthesia Plan Comments:       Anesthesia Quick Evaluation

## 2018-01-03 ENCOUNTER — Encounter (HOSPITAL_BASED_OUTPATIENT_CLINIC_OR_DEPARTMENT_OTHER): Payer: Self-pay | Admitting: Urology

## 2018-01-16 ENCOUNTER — Other Ambulatory Visit: Payer: Self-pay | Admitting: Urology

## 2018-01-30 ENCOUNTER — Encounter (HOSPITAL_BASED_OUTPATIENT_CLINIC_OR_DEPARTMENT_OTHER): Payer: Self-pay | Admitting: *Deleted

## 2018-01-30 ENCOUNTER — Other Ambulatory Visit: Payer: Self-pay

## 2018-01-30 NOTE — Progress Notes (Signed)
Spoke with Annette Npo after midnight food, clear liquids until 715 am day of surgery meds to take: advair, metoprolol Driver spouse george Needs I stat 4 and ekg lov dr Trula Slade 12-19-17 epic/chart Has surgery orders in epic

## 2018-02-03 ENCOUNTER — Encounter (HOSPITAL_BASED_OUTPATIENT_CLINIC_OR_DEPARTMENT_OTHER)
Admission: RE | Disposition: A | Discharge status: Home / Self Care | Payer: Self-pay | Source: Ambulatory Visit | Attending: Urology

## 2018-02-03 ENCOUNTER — Ambulatory Visit (HOSPITAL_BASED_OUTPATIENT_CLINIC_OR_DEPARTMENT_OTHER): Payer: Medicare Other | Admitting: Certified Registered Nurse Anesthetist

## 2018-02-03 ENCOUNTER — Ambulatory Visit (HOSPITAL_BASED_OUTPATIENT_CLINIC_OR_DEPARTMENT_OTHER)
Admission: RE | Admit: 2018-02-03 | Discharge: 2018-02-03 | Disposition: A | Discharge status: Home / Self Care | Payer: Medicare Other | Source: Ambulatory Visit | Attending: Urology | Admitting: Urology

## 2018-02-03 ENCOUNTER — Other Ambulatory Visit: Payer: Self-pay

## 2018-02-03 ENCOUNTER — Encounter (HOSPITAL_BASED_OUTPATIENT_CLINIC_OR_DEPARTMENT_OTHER): Payer: Self-pay | Admitting: Certified Registered Nurse Anesthetist

## 2018-02-03 DIAGNOSIS — C674 Malignant neoplasm of posterior wall of bladder: Secondary | ICD-10-CM | POA: Insufficient documentation

## 2018-02-03 DIAGNOSIS — Z8673 Personal history of transient ischemic attack (TIA), and cerebral infarction without residual deficits: Secondary | ICD-10-CM | POA: Insufficient documentation

## 2018-02-03 DIAGNOSIS — F17200 Nicotine dependence, unspecified, uncomplicated: Secondary | ICD-10-CM | POA: Diagnosis not present

## 2018-02-03 DIAGNOSIS — K219 Gastro-esophageal reflux disease without esophagitis: Secondary | ICD-10-CM | POA: Insufficient documentation

## 2018-02-03 DIAGNOSIS — Z880 Allergy status to penicillin: Secondary | ICD-10-CM | POA: Insufficient documentation

## 2018-02-03 DIAGNOSIS — I10 Essential (primary) hypertension: Secondary | ICD-10-CM | POA: Diagnosis not present

## 2018-02-03 DIAGNOSIS — Z79899 Other long term (current) drug therapy: Secondary | ICD-10-CM | POA: Diagnosis not present

## 2018-02-03 DIAGNOSIS — Z885 Allergy status to narcotic agent status: Secondary | ICD-10-CM | POA: Insufficient documentation

## 2018-02-03 DIAGNOSIS — J449 Chronic obstructive pulmonary disease, unspecified: Secondary | ICD-10-CM | POA: Diagnosis not present

## 2018-02-03 DIAGNOSIS — E7800 Pure hypercholesterolemia, unspecified: Secondary | ICD-10-CM | POA: Insufficient documentation

## 2018-02-03 HISTORY — DX: Dyspnea, unspecified: R06.00

## 2018-02-03 HISTORY — PX: TRANSURETHRAL RESECTION OF BLADDER TUMOR: SHX2575

## 2018-02-03 HISTORY — DX: Cerebral infarction, unspecified: I63.9

## 2018-02-03 LAB — POCT I-STAT 4, (NA,K, GLUC, HGB,HCT)
GLUCOSE: 100 mg/dL — AB (ref 70–99)
HEMATOCRIT: 45 % (ref 36.0–46.0)
HEMOGLOBIN: 15.3 g/dL — AB (ref 12.0–15.0)
Potassium: 3.1 mmol/L — ABNORMAL LOW (ref 3.5–5.1)
Sodium: 138 mmol/L (ref 135–145)

## 2018-02-03 SURGERY — TURBT (TRANSURETHRAL RESECTION OF BLADDER TUMOR)
Anesthesia: General | Site: Bladder

## 2018-02-03 MED ORDER — FENTANYL CITRATE (PF) 100 MCG/2ML IJ SOLN
25.0000 ug | INTRAMUSCULAR | Status: DC | PRN
  Filled 2018-02-03: qty 1

## 2018-02-03 MED ORDER — HYDROCODONE-ACETAMINOPHEN 5-325 MG PO TABS: 1.0000 | ORAL_TABLET | ORAL | 0 refills | Status: DC | PRN

## 2018-02-03 MED ORDER — SODIUM CHLORIDE 0.9 % IR SOLN
Status: DC | PRN
  Administered 2018-02-03: 3000 mL via INTRAVESICAL

## 2018-02-03 MED ORDER — LACTATED RINGERS IV SOLN
INTRAVENOUS | Status: DC
  Administered 2018-02-03: 12:00:00 via INTRAVENOUS
  Filled 2018-02-03: qty 1000

## 2018-02-03 MED ORDER — DEXAMETHASONE SODIUM PHOSPHATE 10 MG/ML IJ SOLN
INTRAMUSCULAR | Status: DC | PRN
  Administered 2018-02-03: 10 mg via INTRAVENOUS

## 2018-02-03 MED ORDER — PROPOFOL 10 MG/ML IV BOLUS
INTRAVENOUS | Status: DC | PRN
  Administered 2018-02-03: 150 mg via INTRAVENOUS

## 2018-02-03 MED ORDER — DEXAMETHASONE SODIUM PHOSPHATE 10 MG/ML IJ SOLN
INTRAMUSCULAR | Status: AC
  Filled 2018-02-03: qty 1

## 2018-02-03 MED ORDER — ONDANSETRON HCL 4 MG/2ML IJ SOLN
4.0000 mg | Freq: Once | INTRAMUSCULAR | Status: DC | PRN
  Filled 2018-02-03: qty 2

## 2018-02-03 MED ORDER — FENTANYL CITRATE (PF) 100 MCG/2ML IJ SOLN
INTRAMUSCULAR | Status: DC | PRN
  Administered 2018-02-03: 50 ug via INTRAVENOUS

## 2018-02-03 MED ORDER — LIDOCAINE 2% (20 MG/ML) 5 ML SYRINGE
INTRAMUSCULAR | Status: AC
  Filled 2018-02-03: qty 5

## 2018-02-03 MED ORDER — ONDANSETRON HCL 4 MG/2ML IJ SOLN
INTRAMUSCULAR | Status: DC | PRN
  Administered 2018-02-03: 4 mg via INTRAVENOUS

## 2018-02-03 MED ORDER — PROPOFOL 10 MG/ML IV BOLUS
INTRAVENOUS | Status: AC
  Filled 2018-02-03: qty 20

## 2018-02-03 MED ORDER — OXYCODONE HCL 5 MG PO TABS
5.0000 mg | ORAL_TABLET | Freq: Once | ORAL | Status: DC | PRN
  Filled 2018-02-03: qty 1

## 2018-02-03 MED ORDER — PHENYLEPHRINE 40 MCG/ML (10ML) SYRINGE FOR IV PUSH (FOR BLOOD PRESSURE SUPPORT)
PREFILLED_SYRINGE | INTRAVENOUS | Status: DC | PRN
  Administered 2018-02-03: 80 ug via INTRAVENOUS
  Administered 2018-02-03: 120 ug via INTRAVENOUS

## 2018-02-03 MED ORDER — ONDANSETRON HCL 4 MG/2ML IJ SOLN
INTRAMUSCULAR | Status: AC
  Filled 2018-02-03: qty 2

## 2018-02-03 MED ORDER — FENTANYL CITRATE (PF) 100 MCG/2ML IJ SOLN
INTRAMUSCULAR | Status: AC
  Filled 2018-02-03: qty 2

## 2018-02-03 MED ORDER — LIDOCAINE HCL (CARDIAC) PF 100 MG/5ML IV SOSY
PREFILLED_SYRINGE | INTRAVENOUS | Status: DC | PRN
  Administered 2018-02-03: 60 mg via INTRAVENOUS

## 2018-02-03 MED ORDER — CIPROFLOXACIN IN D5W 400 MG/200ML IV SOLN
400.0000 mg | INTRAVENOUS | Status: AC
  Administered 2018-02-03: 400 mg via INTRAVENOUS
  Filled 2018-02-03: qty 200

## 2018-02-03 MED ORDER — OXYCODONE HCL 5 MG/5ML PO SOLN
5.0000 mg | Freq: Once | ORAL | Status: DC | PRN
  Filled 2018-02-03: qty 5

## 2018-02-03 MED ORDER — EPHEDRINE SULFATE-NACL 50-0.9 MG/10ML-% IV SOSY
PREFILLED_SYRINGE | INTRAVENOUS | Status: DC | PRN
  Administered 2018-02-03 (×2): 10 mg via INTRAVENOUS

## 2018-02-03 MED ORDER — CIPROFLOXACIN IN D5W 400 MG/200ML IV SOLN
INTRAVENOUS | Status: AC
  Filled 2018-02-03: qty 200

## 2018-02-03 SURGICAL SUPPLY — 24 items
BAG DRAIN URO-CYSTO SKYTR STRL (DRAIN) ×3 IMPLANT
BAG DRN ANRFLXCHMBR STRAP LEK (BAG)
BAG DRN UROCATH (DRAIN) ×1
BAG URINE DRAINAGE (UROLOGICAL SUPPLIES) IMPLANT
BAG URINE LEG 19OZ MD ST LTX (BAG) IMPLANT
BAG URINE LEG 500ML (DRAIN) IMPLANT
CATH FOLEY 2WAY SLVR  5CC 22FR (CATHETERS)
CATH FOLEY 2WAY SLVR 30CC 20FR (CATHETERS) IMPLANT
CATH FOLEY 2WAY SLVR 5CC 22FR (CATHETERS) IMPLANT
CLOTH BEACON ORANGE TIMEOUT ST (SAFETY) ×3 IMPLANT
ELECT REM PT RETURN 9FT ADLT (ELECTROSURGICAL)
ELECTRODE REM PT RTRN 9FT ADLT (ELECTROSURGICAL) ×1 IMPLANT
EVACUATOR MICROVAS BLADDER (UROLOGICAL SUPPLIES) IMPLANT
GLOVE BIO SURGEON STRL SZ7.5 (GLOVE) ×3 IMPLANT
GOWN STRL REUS W/ TWL LRG LVL3 (GOWN DISPOSABLE) ×1 IMPLANT
GOWN STRL REUS W/TWL LRG LVL3 (GOWN DISPOSABLE) ×3
IV NS IRRIG 3000ML ARTHROMATIC (IV SOLUTION) ×5 IMPLANT
KIT TURNOVER CYSTO (KITS) ×3 IMPLANT
LOOP CUT BIPOLAR 24F LRG (ELECTROSURGICAL) ×2 IMPLANT
MANIFOLD NEPTUNE II (INSTRUMENTS) ×2 IMPLANT
PACK CYSTO (CUSTOM PROCEDURE TRAY) ×3 IMPLANT
SYRINGE IRR TOOMEY STRL 70CC (SYRINGE) IMPLANT
TUBE CONNECTING 12'X1/4 (SUCTIONS)
TUBE CONNECTING 12X1/4 (SUCTIONS) IMPLANT

## 2018-02-03 NOTE — Op Note (Signed)
Operative Note  Preoperative diagnosis:  1.  Bladder cancer  Postoperative diagnosis: 1.  Bladder cancer  Procedure(s): 1.  Restage transurethral resection of bladder tumor--medium  Surgeon: Link Snuffer, MD  Assistants: None  Anesthesia: General  Complications: None immediate  EBL: Minimal  Specimens: 1.  Bladder tumor bed  Drains/Catheters: 1.  None  Intraoperative findings: 1.  Normal urethra 2.  Prior resection site with inflammatory change and surrounding erythema on the right posterior lateral wall.  This was well away from the right ureteral orifice.  This was resected down to muscle fibers along the entire resection bed which was about 3 to 4 cm.  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  There were micropapillary features.  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  She was presented the options of early cystectomy as well as restaging TURBT and she greatly desires to not undergo cystectomy and therefore presents for the previously mentioned operation.  Description of procedure:  The patient was identified and consent was obtained.  The patient was taken to the operating room and placed in the supine position.  The patient was placed under general anesthesia.  Perioperative antibiotics were administered.  The patient was placed in dorsal lithotomy.  Patient was prepped and draped in a standard sterile fashion and a timeout was performed.  A 26 French resectoscope with the blunt obturator was advanced into the bladder.  This was exchanged for the working element.  On bipolar settings, the area of interest was resected on bipolar settings.  There were no other concerning lesions.  The resection bed was fulgurated as well as the surrounding areas.  This was well away from the ureteral orifice.  The entire bladder mucosa was  inspected and was free of other tumors.  There were no areas of erythema concerning for other CIS.  Bladder specimens were evacuated.  I reinspected the bladder mucosa and there was no active bleeding noted.  The bladder was drained and the scope withdrawn.  This concluded the operation.  The patient tolerated the procedure well was stable postoperatively.  Plan: Return in 1 week for pathology review.

## 2018-02-03 NOTE — Anesthesia Postprocedure Evaluation (Signed)
Anesthesia Post Note  Patient: Debra Keller  Procedure(s) Performed: RESTAGE TRANSURETHRAL RESECTION OF BLADDER TUMOR (TURBT) (N/A Bladder)     Patient location during evaluation: PACU Anesthesia Type: General Level of consciousness: awake and alert Pain management: pain level controlled Vital Signs Assessment: post-procedure vital signs reviewed and stable Respiratory status: spontaneous breathing, nonlabored ventilation and respiratory function stable Cardiovascular status: blood pressure returned to baseline and stable Postop Assessment: no apparent nausea or vomiting Anesthetic complications: no    Last Vitals:  Vitals:   02/03/18 1445 02/03/18 1500  BP: 125/63 (!) 110/57  Pulse: 66 68  Resp: 13 12  Temp:    SpO2: 99% 94%    Last Pain:  Vitals:   02/03/18 1500  TempSrc:   PainSc: 0-No pain                 Lidia Collum

## 2018-02-03 NOTE — Anesthesia Preprocedure Evaluation (Addendum)
Anesthesia Evaluation  Patient identified by MRN, date of birth, ID band  Reviewed: Allergy & Precautions, NPO status , Patient's Chart, lab work & pertinent test results, reviewed documented beta blocker date and time   History of Anesthesia Complications Negative for: history of anesthetic complications  Airway Mallampati: II  TM Distance: >3 FB Neck ROM: Full    Dental no notable dental hx.    Pulmonary COPD, Current Smoker,    Pulmonary exam normal        Cardiovascular hypertension, Pt. on medications and Pt. on home beta blockers + Peripheral Vascular Disease (AAA 2.8 cm)  Normal cardiovascular exam     Neuro/Psych CVA (residual balance issues), Residual Symptoms negative psych ROS   GI/Hepatic negative GI ROS, Neg liver ROS,   Endo/Other  negative endocrine ROS  Renal/GU negative Renal ROS  negative genitourinary   Musculoskeletal negative musculoskeletal ROS (+)   Abdominal   Peds  Hematology negative hematology ROS (+)   Anesthesia Other Findings   Reproductive/Obstetrics                            Anesthesia Physical Anesthesia Plan  ASA: III  Anesthesia Plan: General   Post-op Pain Management:    Induction: Intravenous  PONV Risk Score and Plan: 3 and Ondansetron, Dexamethasone and Treatment may vary due to age or medical condition  Airway Management Planned: LMA  Additional Equipment: None  Intra-op Plan:   Post-operative Plan: Extubation in OR  Informed Consent: I have reviewed the patients History and Physical, chart, labs and discussed the procedure including the risks, benefits and alternatives for the proposed anesthesia with the patient or authorized representative who has indicated his/her understanding and acceptance.     Plan Discussed with:   Anesthesia Plan Comments:        Anesthesia Quick Evaluation

## 2018-02-03 NOTE — Transfer of Care (Signed)
Immediate Anesthesia Transfer of Care Note  Patient: Debra Keller  Procedure(s) Performed: RESTAGE TRANSURETHRAL RESECTION OF BLADDER TUMOR (TURBT) (N/A Bladder)  Patient Location: PACU  Anesthesia Type:General  Level of Consciousness: awake, alert , oriented and patient cooperative  Airway & Oxygen Therapy: Patient Spontanous Breathing and Patient connected to face mask oxygen  Post-op Assessment: Report given to RN and Post -op Vital signs reviewed and stable  Post vital signs: Reviewed and stable  Last Vitals:  Vitals Value Taken Time  BP 131/72 02/03/2018  2:34 PM  Temp    Pulse 62 02/03/2018  2:37 PM  Resp 13 02/03/2018  2:37 PM  SpO2 100 % 02/03/2018  2:37 PM  Vitals shown include unvalidated device data.  Last Pain:  Vitals:   02/03/18 1151  TempSrc:   PainSc: 0-No pain      Patients Stated Pain Goal: 7 (42/87/[id number] 1157)  Complications: No apparent anesthesia complications

## 2018-02-03 NOTE — H&P (Signed)
CC/HPI: Cc: Bladder cancer  HPI:  77 year old female recently underwent a TURBT with intravesical installation of gemcitabine on 01/02/2018. 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 Cancer invaded the lamina propria at the level below the muscularis mucosa but there was no muscularis propria present. She also had evidence of carcinoma in situ. Patient states she is doing well today. She has no complaints.     ALLERGIES: codeine penicillin     MEDICATIONS: Hydrochlorothiazide 25 mg tablet  Metoprolol Tartrate 100 mg tablet  Simvastatin  Advair Hfa  Aspir 81     GU PSH: Bladder Instill AntiCA Agent - 01/02/2018 Cystoscopy - 12/13/2017 Cystoscopy TURBT 2-5 cm - 01/02/2018 Locm 300-399Mg /Ml Iodine,1Ml - 12/07/2017    NON-GU PSH: Breast lumpectomy, Right Hysterectomy        GU PMH: Gross hematuria - 12/13/2017, - 11/24/2017 Nocturia - 11/24/2017 Urinary Frequency - 11/24/2017    NON-GU PMH: Breast Cancer, History COPD GERD Hypercholesterolemia Hypertension Stroke/TIA    FAMILY HISTORY: 1 Daughter - Other Heart Attack - Father, Mother heart failure - Father   SOCIAL HISTORY: Marital Status: Married Preferred Language: English; Race: White Current Smoking Status: Patient smokes.  <DIV'  Tobacco Use Assessment Completed:  Used Tobacco in last 30 days?   Drinks 4+ caffeinated drinks per day.    REVIEW OF SYSTEMS:     GU Review Female:  Patient denies frequent urination, hard to postpone urination, burning /pain with urination, get up at night to urinate, leakage of urine, stream starts and stops, trouble starting your stream, have to strain to urinate, and being pregnant.    Gastrointestinal (Upper):  Patient denies nausea, vomiting, and indigestion/ heartburn.    Gastrointestinal (Lower):  Patient denies diarrhea and constipation.    Constitutional:  Patient denies fever, night sweats, weight loss, and fatigue.     Skin:  Patient denies skin rash/ lesion and itching.    Eyes:  Patient denies blurred vision and double vision.    Ears/ Nose/ Throat:  Patient denies sore throat and sinus problems.    Hematologic/Lymphatic:  Patient denies swollen glands and easy bruising.    Cardiovascular:  Patient denies leg swelling and chest pains.    Respiratory:  Patient denies cough and shortness of breath.    Endocrine:  Patient denies excessive thirst.    Musculoskeletal:  Patient denies back pain and joint pain.    Neurological:  Patient denies headaches and dizziness.    Psychologic:  Patient denies depression and anxiety.    VITAL SIGNS:       01/10/2018 01:34 PM     BP 114/63 mmHg     Heart Rate 69 /min     Temperature 97.5 F / 36.3 C     MULTI-SYSTEM PHYSICAL EXAMINATION:      Constitutional: Well-nourished. No physical deformities. Normally developed. Good grooming.     Respiratory: No labored breathing, no use of accessory muscles.      Cardiovascular: Normal temperature, adequate perfusion of extremities     Skin: No paleness, no jaundice     Neurologic / Psychiatric: Oriented to time, oriented to place, oriented to person. No depression, no anxiety, no agitation.     Gastrointestinal: No mass, no tenderness, no rigidity, non obese abdomen.     Eyes: Normal conjunctivae. Normal eyelids.     Musculoskeletal: Normal gait and station of head and neck.            PAST DATA REVIEWED:  Source Of History:  Patient  Lab Test Review:  Path Report   PROCEDURES:    Urinalysis w/Scope  Dipstick Dipstick Cont'd Micro  Color: Yellow Bilirubin: Neg mg/dL WBC/hpf: 0 - 5/hpf  Appearance: Cloudy Ketones: Neg mg/dL RBC/hpf: 10 - 20/hpf  Specific Gravity: 1.025 Blood: 3+ ery/uL Bacteria: Mod (26-50/hpf)  pH: <=5.0 Protein: Neg mg/dL Cystals: NS (Not Seen)  Glucose: Neg mg/dL Urobilinogen: 0.2 mg/dL Casts: NS (Not Seen)   Nitrites: Neg Trichomonas: Not Present   Leukocyte Esterase: Trace leu/uL Mucous:  Present    Epithelial Cells: 0 - 5/hpf    Yeast: NS (Not Seen)    Sperm: Not Present    ASSESSMENT:     ICD-10 Details  1 GU:  Bladder Cancer Lateral - C67.2   2  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 I discussed the options of repeat resection to ensure there is no detrusor involvement. Also discussed the option of early cystectomy given that the patient does have adverse features including micropapillary features and presence of carcinoma in situ. The tumor also invaded at the level below the muscularis mucosa. We discussed this in relation to her age. She would like to hold off on cystectomy if able given the possible difficulty recovering from this. I'll plan to discuss her at our monthly tumor board this Friday but tentatively plan for repeat resection.   Cc: [name], M.D.

## 2018-02-03 NOTE — Anesthesia Procedure Notes (Signed)
Procedure Name: LMA Insertion Date/Time: 02/03/2018 1:59 PM Performed by: Raenette Rover, CRNA Pre-anesthesia Checklist: Patient identified, Emergency Drugs available, Suction available and Patient being monitored Patient Re-evaluated:Patient Re-evaluated prior to induction Oxygen Delivery Method: Circle system utilized Preoxygenation: Pre-oxygenation with 100% oxygen Induction Type: IV induction LMA: LMA inserted LMA Size: 4.0 Number of attempts: 1 Placement Confirmation: positive ETCO2,  CO2 detector and breath sounds checked- equal and bilateral Tube secured with: Tape Dental Injury: Teeth and Oropharynx as per pre-operative assessment

## 2018-02-03 NOTE — Discharge Instructions (Addendum)
Transurethral Resection of Bladder Tumor (TURBT) or Bladder Biopsy   Definition:  Transurethral Resection of the Bladder Tumor is a surgical procedure used to diagnose and remove tumors within the bladder. TURBT is the most common treatment for early stage bladder cancer.  General instructions:     Your recent bladder surgery requires very little post hospital care but some definite precautions.  Despite the fact that no skin incisions were used, the area around the bladder incisions are raw and covered with scabs to promote healing and prevent bleeding. Certain precautions are needed to insure that the scabs are not disturbed over the next 2-4 weeks while the healing proceeds.  Because the raw surface inside your bladder and the irritating effects of urine you may expect frequency of urination and/or urgency (a stronger desire to urinate) and perhaps even getting up at night more often. This will usually resolve or improve slowly over the healing period. You may see some blood in your urine over the first 6 weeks. Do not be alarmed, even if the urine was clear for a while. Get off your feet and drink lots of fluids until clearing occurs. If you start to pass clots or don't improve call us.  Diet:  You may return to your normal diet immediately. Because of the raw surface of your bladder, alcohol, spicy foods, foods high in acid and drinks with caffeine may cause irritation or frequency and should be used in moderation. To keep your urine flowing freely and avoid constipation, drink plenty of fluids during the day (8-10 glasses). Tip: Avoid cranberry juice because it is very acidic.  Activity:  Your physical activity doesn't need to be restricted. However, if you are very active, you may see some blood in the urine. We suggest that you reduce your activity under the circumstances until the bleeding has stopped.  Bowels:  It is important to keep your bowels regular during the postoperative  period. Straining with bowel movements can cause bleeding. A bowel movement every other day is reasonable. Use a mild laxative if needed, such as milk of magnesia 2-3 tablespoons, or 2 Dulcolax tablets. Call if you continue to have problems. If you had been taking narcotics for pain, before, during or after your surgery, you may be constipated. Take a laxative if necessary.    Medication:  You should resume your pre-surgery medications unless told not to. In addition you may be given an antibiotic to prevent or treat infection. Antibiotics are not always necessary. All medication should be taken as prescribed until the bottles are finished unless you are having an unusual reaction to one of the drugs.   Post Anesthesia Home Care Instructions  Activity: Get plenty of rest for the remainder of the day. A responsible individual must stay with you for 24 hours following the procedure.  For the next 24 hours, DO NOT: -Drive a car -Operate machinery -Drink alcoholic beverages -Take any medication unless instructed by your physician -Make any legal decisions or sign important papers.  Meals: Start with liquid foods such as gelatin or soup. Progress to regular foods as tolerated. Avoid greasy, spicy, heavy foods. If nausea and/or vomiting occur, drink only clear liquids until the nausea and/or vomiting subsides. Call your physician if vomiting continues.  Special Instructions/Symptoms: Your throat may feel dry or sore from the anesthesia or the breathing tube placed in your throat during surgery. If this causes discomfort, gargle with warm salt water. The discomfort should disappear within 24 hours.       

## 2018-02-06 ENCOUNTER — Encounter (HOSPITAL_BASED_OUTPATIENT_CLINIC_OR_DEPARTMENT_OTHER): Payer: Self-pay | Admitting: Urology

## 2018-04-21 ENCOUNTER — Other Ambulatory Visit: Payer: Self-pay | Admitting: Internal Medicine

## 2018-04-21 DIAGNOSIS — Z1231 Encounter for screening mammogram for malignant neoplasm of breast: Secondary | ICD-10-CM

## 2018-05-25 ENCOUNTER — Ambulatory Visit
Admission: RE | Admit: 2018-05-25 | Discharge: 2018-05-25 | Disposition: A | Discharge status: Home / Self Care | Payer: Medicare Other | Source: Ambulatory Visit | Attending: Internal Medicine | Admitting: Internal Medicine

## 2018-05-25 DIAGNOSIS — Z1231 Encounter for screening mammogram for malignant neoplasm of breast: Secondary | ICD-10-CM

## 2019-04-02 ENCOUNTER — Other Ambulatory Visit: Payer: Self-pay | Admitting: Internal Medicine

## 2019-04-02 DIAGNOSIS — Z1231 Encounter for screening mammogram for malignant neoplasm of breast: Secondary | ICD-10-CM

## 2019-06-20 ENCOUNTER — Ambulatory Visit
Admission: RE | Admit: 2019-06-20 | Discharge: 2019-06-20 | Disposition: A | Discharge status: Home / Self Care | Payer: Medicare Other | Source: Ambulatory Visit | Attending: Internal Medicine | Admitting: Internal Medicine

## 2019-06-20 ENCOUNTER — Other Ambulatory Visit: Payer: Self-pay

## 2019-06-20 DIAGNOSIS — Z1231 Encounter for screening mammogram for malignant neoplasm of breast: Secondary | ICD-10-CM

## 2019-06-21 ENCOUNTER — Other Ambulatory Visit: Payer: Self-pay | Admitting: Internal Medicine

## 2019-06-21 DIAGNOSIS — R928 Other abnormal and inconclusive findings on diagnostic imaging of breast: Secondary | ICD-10-CM

## 2019-07-09 ENCOUNTER — Ambulatory Visit
Admission: RE | Admit: 2019-07-09 | Discharge: 2019-07-09 | Disposition: A | Discharge status: Home / Self Care | Payer: Medicare Other | Source: Ambulatory Visit | Attending: Internal Medicine | Admitting: Internal Medicine

## 2019-07-09 ENCOUNTER — Other Ambulatory Visit: Payer: Self-pay | Admitting: Internal Medicine

## 2019-07-09 ENCOUNTER — Other Ambulatory Visit: Payer: Self-pay

## 2019-07-09 DIAGNOSIS — N6320 Unspecified lump in the left breast, unspecified quadrant: Secondary | ICD-10-CM

## 2019-07-09 DIAGNOSIS — R928 Other abnormal and inconclusive findings on diagnostic imaging of breast: Secondary | ICD-10-CM

## 2019-07-09 DIAGNOSIS — R599 Enlarged lymph nodes, unspecified: Secondary | ICD-10-CM

## 2019-07-18 ENCOUNTER — Other Ambulatory Visit: Payer: Self-pay

## 2019-07-18 ENCOUNTER — Ambulatory Visit
Admission: RE | Admit: 2019-07-18 | Discharge: 2019-07-18 | Disposition: A | Discharge status: Home / Self Care | Payer: Medicare Other | Source: Ambulatory Visit | Attending: Internal Medicine | Admitting: Internal Medicine

## 2019-07-18 ENCOUNTER — Other Ambulatory Visit: Payer: Self-pay | Admitting: Internal Medicine

## 2019-07-18 DIAGNOSIS — N6320 Unspecified lump in the left breast, unspecified quadrant: Secondary | ICD-10-CM

## 2019-07-18 DIAGNOSIS — R599 Enlarged lymph nodes, unspecified: Secondary | ICD-10-CM

## 2019-07-19 ENCOUNTER — Telehealth: Payer: Self-pay | Admitting: Oncology

## 2019-07-19 ENCOUNTER — Encounter: Payer: Self-pay | Admitting: *Deleted

## 2019-07-19 NOTE — Telephone Encounter (Signed)
Spoke with patient to confirm afternoon [facility] appointment for 4/21, packet will be mailed to patient

## 2019-07-20 ENCOUNTER — Other Ambulatory Visit: Payer: Self-pay | Admitting: *Deleted

## 2019-07-20 DIAGNOSIS — Z170 Estrogen receptor positive status [ER+]: Secondary | ICD-10-CM

## 2019-07-20 DIAGNOSIS — C50212 Malignant neoplasm of upper-inner quadrant of left female breast: Secondary | ICD-10-CM | POA: Insufficient documentation

## 2019-07-24 NOTE — Progress Notes (Signed)
[facility]  [phone] Fax:([phone]     ID: Leyah Bocchino Freid DOB: 06-May-1940  MR#: [id number]  YME#:[id number]  Patient Care Team: Sueanne Margarita, DO as PCP - General (Internal Medicine) Lucas Mallow, MD as Consulting Physician (Urology) Mauro Kaufmann, RN as Oncology Nurse Navigator Rockwell Germany, RN as Oncology Nurse Navigator Erroll Luna, MD as Consulting Physician (General Surgery) Deosha Werden, Virgie Dad, MD as Consulting Physician (Oncology) Eppie Gibson, MD as Attending Physician (Radiation Oncology) Chauncey Cruel, MD OTHER MD:  CHIEF COMPLAINT: Estrogen receptor positive breast cancer  CURRENT TREATMENT: Awaiting definitive surgery   HISTORY OF CURRENT ILLNESS: "Fraser Din" was last seen in the breast cancer clinic in 03/2012. 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  She had routine screening mammography on 06/20/2019 showing a possible abnormality in the left breast. She underwent left diagnostic mammography with tomography and left breast ultrasonography at [facility] on 07/09/2019 showing: breast density category B; 4 mm spiculated mass in left breast at 11:30; three abnormal lymph nodes, possibly related to recent Covid-19 vaccine.  Accordingly on 07/18/2019 she proceeded to biopsy of the left breast area in question. The pathology from this procedure ([id number]) showed: invasive mammary carcinoma, grade 2, e-cadherin positive. Prognostic indicators significant for: estrogen receptor, 100% positive and progesterone receptor, 100% positive, both with strong staining intensity. Proliferation marker Ki67 at 1%. HER2 negative by  immunohistochemistry (1+).  The patient's subsequent history is as detailed below.   INTERVAL HISTORY: Dorrine "PAT" was evaluated in the multidisciplinary breast cancer clinic on 07/25/2019 accompanied by her husband Iona Beard. Her case was also presented at the multidisciplinary breast cancer conference on the same day. At that time a preliminary plan was proposed: Breast conserving surgery with sentinel lymph node sampling, no Oncotype, consider radiation, then antiestrogens   REVIEW OF SYSTEMS: There were no specific symptoms leading to the original mammogram, which was routinely scheduled. On the provided questionnaire, Fraser Din reports wearing glasses (and notes recent cataract surgery), wearing dentures, and shortness of breath with walking. The patient denies unusual headaches, visual changes, nausea, vomiting, stiff neck, dizziness, or gait imbalance. There has been no cough, phlegm production, or pleurisy, no chest pain or pressure, and no change in bowel or bladder habits. The patient denies fever, rash, bleeding, unexplained fatigue or unexplained weight loss. A detailed review of systems was otherwise entirely negative.   PAST MEDICAL HISTORY: Past Medical History:  Diagnosis Date  . AAA (abdominal aortic aneurysm) without rupture [facility])    vascular consult w/ dr Trula Slade 12-19-2017,  measures 2.8cm  . Balance problem    uses cane  . Bladder tumor   . Breast cancer, stage 1, estrogen receptor positive, right [facility]) oncologist-  dr Jana Hakim   dx 07/ 2008----  Stage IA (T1b,N0)  Grade 1,  invasive lobular carcinoma--- s/p  right breast lumpectomy w/ sln dissections 10-13-2006  and completed radiation 10/ 2008,  completed tamoxifen 2016  . Centrilobular emphysema ([place])    per CT 12-07-2017  . COPD (chronic obstructive pulmonary disease) ([place])   . Dyspnea    with exertion  . Frequency of urination   . Full dentures   . Hematuria   . History of external beam radiation therapy  completed 10/ 2008  right breast cancer  . History of TIAs    12-29-2017 per pt never had symptoms, but pcp found per MRI had 3 TIAs,  residual balance issues  . Hyperlipidemia   . Hypertension   . Stroke [facility] few yrs agi  . Wears glasses     PAST SURGICAL HISTORY: Past Surgical History:  Procedure Laterality Date  . ABDOMINAL HYSTERECTOMY  1980s   W/ UNILATERAL SALPINGOOPHORECTOMY  . APPENDECTOMY  many yrs ago  . BREAST LUMPECTOMY WITH NEEDLE LOCALIZATION AND AXILLARY LYMPH NODE DISSECTION Right 10-13-2006   dr cornett  @[email or web address]   . SHOULDER OPEN ROTATOR CUFF REPAIR Left early 2000s  . TONSILLECTOMY  child  . TRANSURETHRAL RESECTION OF BLADDER TUMOR N/A 02/03/2018   Procedure: RESTAGE TRANSURETHRAL RESECTION OF BLADDER TUMOR (TURBT);  Surgeon: Lucas Mallow, MD;  Location: [facility];  Service: Urology;  Laterality: N/A;  . TRANSURETHRAL RESECTION OF BLADDER TUMOR WITH MITOMYCIN-C N/A 01/02/2018   Procedure: TRANSURETHRAL RESECTION OF BLADDER TUMOR WITH GEMCITABINE;  Surgeon: Lucas Mallow, MD;  Location: [facility];  Service: Urology;  Laterality: N/A;    FAMILY HISTORY: No family history on file.  Fraser Din has little information on her father. Her mother died at age 99. Fraser Din has two brothers. There is no cancer in her family to her knowledge.  GYNECOLOGIC HISTORY:  No LMP recorded. Patient has had a hysterectomy. Menarche: 79 years old Age at first live birth: 79 years old [place] P 1 LMP 1983 Contraceptive: never used HRT: never used  Hysterectomy? Yes, 1983 BSO? One removed   SOCIAL HISTORY: (updated 07/2019)  Mardene Celeste "PAT" is currently retired from working as a Network engineer. Husband is retired and now Insurance claims handler as a hobby. Daughter Lattie Haw, age 70, lives here in [place] with her 5 children. 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    ADVANCED DIRECTIVES: In the absence of any documentation to the contrary, the patient's spouse is their HCPOA.    HEALTH  MAINTENANCE: Social History   Tobacco Use  . Smoking status: Current Every Day Smoker    Packs/day: 0.25    Years: 60.00    Pack years: 15.00    Types: Cigarettes  . Smokeless tobacco: Never Used  . Tobacco comment: 12-29-2017 per pt 4-5 cig per day  Substance Use Topics  . Alcohol use: Yes    Comment: very rare  . Drug use: Never     Colonoscopy: date unknown  PAP: date unknown, s/p hysterectomy  Bone density: never done   Allergies  Allergen Reactions  . Penicillins Nausea Only  . Morphine And Related Nausea Only    Current Outpatient Medications  Medication Sig Dispense Refill  . aspirin EC 81 MG tablet Take 81 mg by mouth daily.    . Fluticasone-Salmeterol (ADVAIR) 250-50 MCG/DOSE AEPB Inhale 2 puffs into the lungs once.     . hydrochlorothiazide (HYDRODIURIL) 25 MG tablet Take 25 mg by mouth every morning.     Marland Kitchen HYDROcodone-acetaminophen (NORCO) 5-325 MG tablet Take 1 tablet by mouth every 4 (four) hours as needed for moderate pain. 10 tablet 0  . metoprolol succinate (TOPROL-XL) 100 MG 24 hr tablet Take 100 mg by mouth every morning.      No current facility-administered medications for this visit.    OBJECTIVE: White woman using a cane  Vitals:   07/25/19 1310  BP: 121/70  Pulse: 63  Resp: 18  Temp: 98.5 F (36.9 C)  SpO2: 97%  Body mass index is 24.8 kg/m.   Wt Readings from Last 3 Encounters:  07/25/19 127 lb (57.6 kg)  01/30/18 133 lb (60.3 kg)  01/02/18 131 lb 14.4 oz (59.8 kg)      ECOG FS:2 - Symptomatic, <50% confined to bed  Ocular: Sclerae unicteric, pupils round and equal Ear-nose-throat: Wearing a mask Lymphatic: No cervical or supraclavicular adenopathy Lungs no rales or rhonchi Heart regular rate and rhythm Abd soft, nontender, positive bowel sounds MSK no focal spinal tenderness, no joint edema Neuro: non-focal, well-oriented, appropriate affect Breasts: The right breast is status post remote lumpectomy and radiation.  There is  no evidence of local recurrence.  The left breast is status post recent biopsy.  There is a mild ecchymosis.  Both axillae are benign.  LAB RESULTS:  CMP     Component Value Date/Time   NA 141 07/25/2019 1205   NA 142 02/28/2012 1352   K 4.5 07/25/2019 1205   K 3.7 02/28/2012 1352   CL 101 07/25/2019 1205   CL 98 02/28/2012 1352   CO2 31 07/25/2019 1205   CO2 33 (H) 02/28/2012 1352   GLUCOSE 112 (H) 07/25/2019 1205   GLUCOSE 101 (H) 02/28/2012 1352   BUN 23 07/25/2019 1205   BUN 16.0 02/28/2012 1352   CREATININE 0.92 07/25/2019 1205   CREATININE 0.8 02/28/2012 1352   CALCIUM 9.9 07/25/2019 1205   CALCIUM 9.8 02/28/2012 1352   PROT 7.2 07/25/2019 1205   PROT 6.8 02/28/2012 1352   ALBUMIN 3.7 07/25/2019 1205   ALBUMIN 3.5 02/28/2012 1352   AST 16 07/25/2019 1205   AST 14 02/28/2012 1352   ALT 12 07/25/2019 1205   ALT 14 02/28/2012 1352   ALKPHOS 88 07/25/2019 1205   ALKPHOS 75 02/28/2012 1352   BILITOT 0.5 07/25/2019 1205   BILITOT 0.34 02/28/2012 1352   GFRNONAA 60 (L) 07/25/2019 1205   GFRAA >60 07/25/2019 1205    No results found for: TOTALPROTELP, ALBUMINELP, A1GS, A2GS, BETS, BETA2SER, GAMS, MSPIKE, SPEI  Lab Results  Component Value Date   WBC 10.6 (H) 07/25/2019   NEUTROABS 6.5 07/25/2019   HGB 15.4 (H) 07/25/2019   HCT 47.1 (H) 07/25/2019   MCV 92.0 07/25/2019   PLT 300 07/25/2019    Lab Results  Component Value Date   LABCA2 14 02/28/2012    No components found for: ZYYQMG500  No results for input(s): INR in the last 168 hours.  Lab Results  Component Value Date   LABCA2 14 02/28/2012    No results found for: BBC488  No results found for: QBV694  No results found for: HWT888  No results found for: CA2729  No components found for: HGQUANT  No results found for: CEA1 / No results found for: CEA1   No results found for: AFPTUMOR  No results found for: CHROMOGRNA  No results found for: KPAFRELGTCHN, LAMBDASER,  KAPLAMBRATIO (kappa/lambda light chains)  No results found for: HGBA, HGBA2QUANT, HGBFQUANT, HGBSQUAN (Hemoglobinopathy evaluation)   Lab Results  Component Value Date   LDH 134 01/09/2007    No results found for: IRON, TIBC, IRONPCTSAT (Iron and TIBC)  No results found for: FERRITIN  Urinalysis No results found for: COLORURINE, APPEARANCEUR, LABSPEC, PHURINE, GLUCOSEU, HGBUR, BILIRUBINUR, KETONESUR, PROTEINUR, UROBILINOGEN, NITRITE, LEUKOCYTESUR   STUDIES: US BREAST LTD UNI LEFT INC AXILLA  Result Date: 07/09/2019 CLINICAL DATA:  Possible mass with distortion upper, slightly medial left breast on a recent screening mammogram. The patient recently had her 2nd COVID-19 vaccine  in the left arm. The 1st vaccine was also in the left arm. EXAM: DIGITAL DIAGNOSTIC LEFT MAMMOGRAM WITH TOMO ULTRASOUND LEFT BREAST COMPARISON:  Previous exam(s). ACR Breast Density Category b: There are scattered areas of fibroglandular density. FINDINGS: 3D tomographic and 2D generated spot compression views of the left breast confirm a small, irregular, spiculated mass in the upper, slightly inner aspect of the breast in the middle 3rd. On physical exam, no mass palpable in the left breast or left axilla. Targeted ultrasound is performed, showing a 4 x 4 x 3 mm irregular, hypoechoic mass mild surrounding ill-defined increased echogenicity in the 11:30 o'clock position of the left breast, 7 cm from the nipple. This corresponds to the mammographic mass. Also demonstrated are 3 adjacent left axillary lymph nodes with mild diffuse and eccentric cortical thickening, including a small node without a visible fatty hilum. The other 2 nodes have compressed hila. The maximum cortical thickness is 3.3 mm. Other left axillary lymph nodes have normal, thin cortices. IMPRESSION: 1. 4 mm spiculated mass in the 11:30 o'clock position of the left breast with imaging features highly suspicious for malignancy. 2. 3 abnormal left axillary  lymph nodes. These could represent metastatic lymph nodes or reactive nodes related to the patient's recent COVID-19 vaccine. RECOMMENDATION: 1. Ultrasound-guided core needle biopsy of the 4 mm mass in the 11:30 o'clock position of the left breast. 2. Ultrasound-guided core needle biopsy of 1 of the abnormal left axillary lymph nodes. If the lymph nodes have a more normal appearance when the patient returns for the biopsy, compatible with reactive lymph nodes, the lymph node biopsy may not need to be performed. This has been discussed with the patient. The biopsies have been scheduled at 1:45 p.m. on 07/18/2019. I have discussed the findings and recommendations with the patient. If applicable, a reminder letter will be sent to the patient regarding the next appointment. BI-RADS CATEGORY  5: Highly suggestive of malignancy. Electronically Signed   By: Claudie Revering M.D.   On: 07/09/2019 17:13   MM DIAG BREAST TOMO UNI LEFT  Result Date: 07/09/2019 CLINICAL DATA:  Possible mass with distortion upper, slightly medial left breast on a recent screening mammogram. The patient recently had her 2nd COVID-19 vaccine in the left arm. The 1st vaccine was also in the left arm. EXAM: DIGITAL DIAGNOSTIC LEFT MAMMOGRAM WITH TOMO ULTRASOUND LEFT BREAST COMPARISON:  Previous exam(s). ACR Breast Density Category b: There are scattered areas of fibroglandular density. FINDINGS: 3D tomographic and 2D generated spot compression views of the left breast confirm a small, irregular, spiculated mass in the upper, slightly inner aspect of the breast in the middle 3rd. On physical exam, no mass palpable in the left breast or left axilla. Targeted ultrasound is performed, showing a 4 x 4 x 3 mm irregular, hypoechoic mass mild surrounding ill-defined increased echogenicity in the 11:30 o'clock position of the left breast, 7 cm from the nipple. This corresponds to the mammographic mass. Also demonstrated are 3 adjacent left axillary lymph  nodes with mild diffuse and eccentric cortical thickening, including a small node without a visible fatty hilum. The other 2 nodes have compressed hila. The maximum cortical thickness is 3.3 mm. Other left axillary lymph nodes have normal, thin cortices. IMPRESSION: 1. 4 mm spiculated mass in the 11:30 o'clock position of the left breast with imaging features highly suspicious for malignancy. 2. 3 abnormal left axillary lymph nodes. These could represent metastatic lymph nodes or reactive nodes related to the patient's recent  COVID-19 vaccine. RECOMMENDATION: 1. Ultrasound-guided core needle biopsy of the 4 mm mass in the 11:30 o'clock position of the left breast. 2. Ultrasound-guided core needle biopsy of 1 of the abnormal left axillary lymph nodes. If the lymph nodes have a more normal appearance when the patient returns for the biopsy, compatible with reactive lymph nodes, the lymph node biopsy may not need to be performed. This has been discussed with the patient. The biopsies have been scheduled at 1:45 p.m. on 07/18/2019. I have discussed the findings and recommendations with the patient. If applicable, a reminder letter will be sent to the patient regarding the next appointment. BI-RADS CATEGORY  5: Highly suggestive of malignancy. Electronically Signed   By: Claudie Revering M.D.   On: 07/09/2019 17:13   MM CLIP PLACEMENT LEFT  Result Date: 07/19/2019 CLINICAL DATA:  Patient status post ultrasound-guided core needle biopsy left breast mass. EXAM: DIAGNOSTIC LEFT MAMMOGRAM POST ULTRASOUND BIOPSY COMPARISON:  Previous exam(s). FINDINGS: Mammographic images were obtained following ultrasound guided biopsy of left breast mass 11:30 o'clock. The biopsy marking clip is in expected position at the site of biopsy. IMPRESSION: Appropriate positioning of the ribbon shaped biopsy marking clip at the site of biopsy in the left breast mass 11:30 o'clock. Final Assessment: Post Procedure Mammograms for Marker Placement  Electronically Signed   By: Lovey Newcomer M.D.   On: 07/19/2019 07:59   [place] LT BREAST BX W LOC DEV 1ST LESION IMG BX SPEC US GUIDE  Addendum Date: 07/19/2019   ADDENDUM REPORT: 07/19/2019 13:48 ADDENDUM: Pathology revealed GRADE II INVASIVE MAMMARY CARCINOMA of the LEFT breast, 11:30 o'clock. This was found to be concordant by Dr. Lovey Newcomer. Left axilla was scanned noting a residual thickened lymph node; however, due to the location within the axilla the node was not amenable to percutaneous biopsy. Pathology results were discussed with the patient by telephone. The patient reported doing well after the biopsy with tenderness at the site. Post biopsy instructions and care were reviewed and questions were answered. The patient was encouraged to call [facility] for any additional concerns. The patient was referred to [facility] on July 25, 2019. Pathology results reported by Stacie Acres RN on 07/19/2019. Electronically Signed   By: Lovey Newcomer M.D.   On: 07/19/2019 13:48   Result Date: 07/19/2019 CLINICAL DATA:  Patient presents for ultrasound-guided core needle biopsy of left breast mass 11:30 o'clock. Additionally, on the prior diagnostic evaluation, patient had 3 cortically thickened lymph nodes. Patient had recently undergone left upper extremity COVID-19 vaccination. It was recommended for these nodes to be re-scanned prior to percutaneous biopsy today. EXAM: ULTRASOUND GUIDED LEFT BREAST CORE NEEDLE BIOPSY COMPARISON:  Previous exam(s). PROCEDURE: I met with the patient and we discussed the procedure of ultrasound-guided biopsy, including benefits and alternatives. We discussed the high likelihood of a successful procedure. We discussed the risks of the procedure, including infection, bleeding, tissue injury, clip migration, and inadequate sampling. Informed written consent was given. The usual time-out  protocol was performed immediately prior to the procedure. Lesion quadrant: Upper inner quadrant Using sterile technique and 1% Lidocaine as local anesthetic, under direct ultrasound visualization, a 14 gauge spring-loaded device was used to perform biopsy of left breast mass 11:30 o'clock using a medial approach. At the conclusion of the procedure a ribbon shaped tissue marker clip was deployed into the biopsy cavity. Follow up 2 view mammogram was performed  and dictated separately. Note the left axilla was scanned. There was interval decrease in size of at least 1 of the previously identified cortically thickened left axillary lymph nodes. A single residual thickened lymph node was identified within the left axilla measuring up to 6 mm however this node was just deep and lateral to axillary vasculature (arteries and veins) and due to the location was not amenable to percutaneous biopsy. IMPRESSION: Ultrasound guided biopsy of left breast mass 11:30 o'clock. No apparent complications. Note a few of the lymph nodes previously visualized have decreased in cortical thickening suggestive of a reactive nature which is improving. There was 1 residual cortically thickened left axillary lymph node on ultrasound today however given the location of this node, percutaneous biopsy was not able to be performed given the surrounding vasculature. Electronically Signed: By: Lovey Newcomer M.D. On: 07/18/2019 14:47     ELIGIBLE FOR AVAILABLE RESEARCH PROTOCOL: AET  ASSESSMENT: 79 y.o. 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  (2) status post left breast upper inner quadrant biopsy 07/18/2019 for a clinical T1a NX, stage IA invasive  ductal carcinoma, grade 2, estrogen and progesterone receptor positive, HER-2 not amplified, with an MIB-1 of 1%  (3) definitive surgery pending  (4) adjuvant radiation as appropriate  (5) anastrozole to start at the completion of local treatment  PLAN: I met today with Emie to review her new diagnosis. Specifically we discussed the biology of her breast cancer, its diagnosis, staging, treatment  options and prognosis.  She understands this is a different breast cancer (ductal not lobular) in the contralateral breast. We reviewed the fact that cancer is not one disease but more than 100 different diseases and that it is important to keep them separate-- otherwise when friends and relatives discuss their own cancer experiences with Zineb confusion can result. Similarly we explained that if breast cancer spreads to the bone or liver, the patient would not have bone cancer or liver cancer, but breast cancer in the bone and breast cancer in the liver: one cancer in three places-- not 3 different cancers which otherwise would have to be treated in 3 different ways.  We discussed the difference between local and systemic therapy. In terms of loco-regional treatment, lumpectomy plus radiation is equivalent to mastectomy as far as survival is concerned. For this reason, and because the cosmetic results are generally superior, we recommend breast conserving surgery.   Next we went over the options for systemic therapy which are anti-estrogens, anti-HER-2 immunotherapy, and chemotherapy. Rosa does not meet criteria for anti-HER-2 immunotherapy. She is a good candidate for anti-estrogens.  The question of chemotherapy is more complicated. Chemotherapy is most effective in rapidly growing, aggressive tumors. It is much less effective in low-grade, slow growing cancers, like Rosiland 's.  In addition we generally do not do chemotherapy in tumors 5 mm or less as this appears to be.  Accordingly the  plan will be for definitive surgery, consideration of adjuvant radiation, and then antiestrogens.  Since she took tamoxifen previously she would take anastrozole now.  This would also avoid concerns regarding clots in this patient with a history of multiple strokes.  Blythe has a good understanding of the overall plan. She agrees with it. She  knows the goal of treatment in her case is cure. She will call with any problems that may develop before her next visit here.  Total encounter time 55 minutes.Sarajane Jews C. Russell Engelstad, MD 07/25/2019 5:17 PM Medical Oncology and Hematology [facility] [place], [place] [address] Tel. [phone]    Fax. [phone]   This document serves as a record of services personally performed by Lurline Del, MD. It was created on his behalf by Wilburn Mylar, a trained medical scribe. The creation of this record is based on the scribe's personal observations and the provider's statements to them.   I, Lurline Del MD, have reviewed the above documentation for accuracy and completeness, and I agree with the above.    *Total Encounter Time as defined by the Centers for Medicare and Medicaid Services includes, in addition to the face-to-face time of a patient visit (documented in the note above) non-face-to-face time: obtaining and reviewing outside history, ordering and reviewing medications, tests or procedures, care coordination (communications with other health care professionals or caregivers) and documentation in the medical record.

## 2019-07-25 ENCOUNTER — Inpatient Hospital Stay: Payer: Medicare Other

## 2019-07-25 ENCOUNTER — Encounter: Payer: Self-pay | Admitting: Physical Therapy

## 2019-07-25 ENCOUNTER — Inpatient Hospital Stay: Payer: Medicare Other | Attending: Oncology | Admitting: Oncology

## 2019-07-25 ENCOUNTER — Encounter: Payer: Self-pay | Admitting: Radiation Oncology

## 2019-07-25 ENCOUNTER — Encounter: Payer: Self-pay | Admitting: *Deleted

## 2019-07-25 ENCOUNTER — Ambulatory Visit
Admission: RE | Admit: 2019-07-25 | Discharge: 2019-07-25 | Disposition: A | Discharge status: Home / Self Care | Payer: Medicare Other | Source: Ambulatory Visit | Attending: Radiation Oncology | Admitting: Radiation Oncology

## 2019-07-25 ENCOUNTER — Ambulatory Visit: Payer: Medicare Other | Attending: Surgery | Admitting: Physical Therapy

## 2019-07-25 ENCOUNTER — Other Ambulatory Visit: Payer: Self-pay

## 2019-07-25 ENCOUNTER — Ambulatory Visit: Payer: Self-pay | Admitting: Surgery

## 2019-07-25 ENCOUNTER — Encounter: Payer: Self-pay | Admitting: Licensed Clinical Social Worker

## 2019-07-25 VITALS — BP 121/70 | HR 63 | Temp 98.5°F | Resp 18 | Ht 60.0 in | Wt 127.0 lb

## 2019-07-25 DIAGNOSIS — Z170 Estrogen receptor positive status [ER+]: Secondary | ICD-10-CM

## 2019-07-25 DIAGNOSIS — R293 Abnormal posture: Secondary | ICD-10-CM | POA: Diagnosis present

## 2019-07-25 DIAGNOSIS — C50212 Malignant neoplasm of upper-inner quadrant of left female breast: Secondary | ICD-10-CM

## 2019-07-25 DIAGNOSIS — R262 Difficulty in walking, not elsewhere classified: Secondary | ICD-10-CM | POA: Insufficient documentation

## 2019-07-25 DIAGNOSIS — I639 Cerebral infarction, unspecified: Secondary | ICD-10-CM | POA: Insufficient documentation

## 2019-07-25 DIAGNOSIS — C50912 Malignant neoplasm of unspecified site of left female breast: Secondary | ICD-10-CM

## 2019-07-25 DIAGNOSIS — Z923 Personal history of irradiation: Secondary | ICD-10-CM

## 2019-07-25 DIAGNOSIS — Z853 Personal history of malignant neoplasm of breast: Secondary | ICD-10-CM | POA: Diagnosis not present

## 2019-07-25 DIAGNOSIS — F17210 Nicotine dependence, cigarettes, uncomplicated: Secondary | ICD-10-CM

## 2019-07-25 DIAGNOSIS — I6349 Cerebral infarction due to embolism of other cerebral artery: Secondary | ICD-10-CM

## 2019-07-25 DIAGNOSIS — Z90721 Acquired absence of ovaries, unilateral: Secondary | ICD-10-CM | POA: Diagnosis not present

## 2019-07-25 LAB — CBC WITH DIFFERENTIAL (CANCER CENTER ONLY)
Abs Immature Granulocytes: 0.03 10*3/uL (ref 0.00–0.07)
Basophils Absolute: 0 10*3/uL (ref 0.0–0.1)
Basophils Relative: 0 %
Eosinophils Absolute: 0.2 10*3/uL (ref 0.0–0.5)
Eosinophils Relative: 2 %
HCT: 47.1 % — ABNORMAL HIGH (ref 36.0–46.0)
Hemoglobin: 15.4 g/dL — ABNORMAL HIGH (ref 12.0–15.0)
Immature Granulocytes: 0 %
Lymphocytes Relative: 29 %
Lymphs Abs: 3.1 10*3/uL (ref 0.7–4.0)
MCH: 30.1 pg (ref 26.0–34.0)
MCHC: 32.7 g/dL (ref 30.0–36.0)
MCV: 92 fL (ref 80.0–100.0)
Monocytes Absolute: 0.7 10*3/uL (ref 0.1–1.0)
Monocytes Relative: 7 %
Neutro Abs: 6.5 10*3/uL (ref 1.7–7.7)
Neutrophils Relative %: 62 %
Platelet Count: 300 10*3/uL (ref 150–400)
RBC: 5.12 MIL/uL — ABNORMAL HIGH (ref 3.87–5.11)
RDW: 14.6 % (ref 11.5–15.5)
WBC Count: 10.6 10*3/uL — ABNORMAL HIGH (ref 4.0–10.5)
nRBC: 0 % (ref 0.0–0.2)

## 2019-07-25 LAB — CMP (CANCER CENTER ONLY)
ALT: 12 U/L (ref 0–44)
AST: 16 U/L (ref 15–41)
Albumin: 3.7 g/dL (ref 3.5–5.0)
Alkaline Phosphatase: 88 U/L (ref 38–126)
Anion gap: 9 (ref 5–15)
BUN: 23 mg/dL (ref 8–23)
CO2: 31 mmol/L (ref 22–32)
Calcium: 9.9 mg/dL (ref 8.9–10.3)
Chloride: 101 mmol/L (ref 98–111)
Creatinine: 0.92 mg/dL (ref 0.44–1.00)
GFR, Est AFR Am: >60 mL/min (ref >60)
GFR, Estimated: 60 mL/min — ABNORMAL LOW (ref >60)
Glucose, Bld: 112 mg/dL — ABNORMAL HIGH (ref 70–99)
Potassium: 4.5 mmol/L (ref 3.5–5.1)
Sodium: 141 mmol/L (ref 135–145)
Total Bilirubin: 0.5 mg/dL (ref 0.3–1.2)
Total Protein: 7.2 g/dL (ref 6.5–8.1)

## 2019-07-25 LAB — GENETIC SCREENING ORDER

## 2019-07-25 NOTE — H&P (Signed)
Debra Keller Documented: 07/25/2019 8:15 AM Location: [facility] Patient #: [id number] DOB: [birth year] Undefined / Language: Debra Keller / Race: White Female  History of Present Illness Debra Keller A. 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 No hx of breast pain mass or discharge. 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 ACR Breast Density Category b: There are scattered areas of fibroglandular density. FINDINGS: 3D tomographic and 2D generated spot compression views of the left breast confirm a small, irregular, spiculated mass in the upper, slightly inner aspect of the breast in the middle 3rd. On physical exam, no mass palpable in the left breast or left axilla. Targeted ultrasound is performed, showing a 4 x 4 x 3 mm irregular, hypoechoic mass mild surrounding ill-defined increased echogenicity in the 11:30 o'clock position of the left breast, 7 cm from the nipple. This corresponds to the mammographic mass. Also demonstrated are 3 adjacent left axillary lymph nodes with mild diffuse and eccentric cortical thickening, including a small node without a visible fatty hilum. The other 2 nodes have compressed hila. The maximum cortical thickness is 3.3 mm. Other left axillary lymph nodes have normal, thin cortices. IMPRESSION: 1. 4 mm spiculated mass in the 11:30 o'clock position of the left breast with imaging features highly suspicious for malignancy. 2. 3 abnormal left axillary lymph nodes. These could represent metastatic lymph nodes or reactive nodes related to the patient's recent COVID-19 vaccine. RECOMMENDATION: 1. Ultrasound-guided core needle biopsy of the 4 mm mass in the 11:30 o'clock position of the left breast. 2. Ultrasound-guided core needle biopsy of 1  of the abnormal left axillary lymph nodes. If the lymph nodes have a more normal appearance when the patient returns for the biopsy, compatible with reactive lymph nodes, the lymph node biopsy may not need to be performed. This has been discussed with the patient. The biopsies have been scheduled at 1:45 p.m. on 07/18/2019. I have discussed the findings and recommendations with the patient. If applicable, a reminder letter will be sent to the patient regarding the next appointment. BI-RADS CATEGORY 5: Highly suggestive of malignancy. Electronically Signed By: Debra Keller M.D. On: 07/09/2019 17:13  Result History  MM DIAG BREAST TOMO UNI LEFT (Order #[id number]) on 07/09/2019 - Order Result History Report [email or web address]>  US BREAST LTD UNI LEFT INC AXILLA (Order [id number]) - Reflex for Order [id number] Study Result  CLINICAL DATA: Possible mass with distortion upper, slightly medial left breast on a recent screening mammogram. The patient recently had her 2nd COVID-19 vaccine in the left arm. The 1st vaccine was also in the left arm.                 ADDITIONAL INFORMATION: PROGNOSTIC INDICATORS Results: IMMUNOHISTOCHEMICAL AND MORPHOMETRIC ANALYSIS PERFORMED MANUALLY The tumor cells are NEGATIVE for Her2 (1+). Estrogen Receptor: 100%, POSITIVE, STRONG STAINING INTENSITY Progesterone Receptor: 100%, POSITIVE, STRONG STAINING INTENSITY Proliferation Marker Ki67: 1% REFERENCE RANGE ESTROGEN RECEPTOR NEGATIVE 0% POSITIVE =>1% REFERENCE RANGE PROGESTERONE RECEPTOR NEGATIVE 0% POSITIVE =>1% All controls stained appropriately Debra Sheller MD Pathologist, Electronic Signature ( Signed 07/20/2019) E-cadherin is positive consistent with a ductal phenotype. Debra Males MD Pathologist, Electronic Signature ( Signed 07/20/2019) 1 of 3 FINAL for [name], [place] [phone]) FINAL DIAGNOSIS Diagnosis Breast, left, needle core biopsy, 11:30  o'clock - INVASIVE  MAMMARY CARCINOMA, SEE COMMENT. Microscopic Comment The carcinoma appears grade 2 and measures 4 mm in greatest linear extent. E-cadherin will be ordered. Prognostic makers will be ordered. Dr. Kish has reviewed the case. The case was called to [facility] on 07/19/2019. [name] MD Pathologist, Electronic Signature (Case signed 07/19/2019).  The patient is a 79 year old female.   Past Surgical History (Debra Smith, RN; 07/25/2019 8:15 AM) Appendectomy Breast Biopsy Left. Breast Mass; Local Excision Right. Hysterectomy (not due to cancer) - Partial Tonsillectomy  Diagnostic Studies History (Debra Smith, RN; 07/25/2019 8:15 AM) Colonoscopy 1-5 years ago Mammogram within last year Pap Smear >5 years ago  Medication History (Debra Smith, RN; 07/25/2019 8:15 AM) Medications Reconciled  Social History (Debra Smith, RN; 07/25/2019 8:15 AM) Alcohol use Occasional alcohol use. Caffeine use Coffee. No drug use Tobacco use Current every day smoker.  Family History (Debra Smith, RN; 07/25/2019 8:15 AM) Family history unknown First Degree Relatives  Pregnancy / Birth History (Debra Smith, RN; 07/25/2019 8:15 AM) Age at menarche 17 years. Age of menopause 56-60 Gravida 2 Maternal age 15-20 Para 2 Regular periods  Other Problems (Debra Smith, RN; 07/25/2019 8:15 AM) Chronic Obstructive Lung Disease High blood pressure Myocardial infarction     Review of Systems (Debra Keller A. Debra Bryk MD; 07/25/2019 3:09 PM) General Not Present- Appetite Loss, Chills, Fatigue, Fever, Night Sweats, Weight Gain and Weight Loss. Skin Not Present- Change in Wart/Mole, Dryness, Hives, Jaundice, New Lesions, Non-Healing Wounds, Rash and Ulcer. HEENT Present- Ringing in the Ears. Not Present- Earache, Hearing Loss, Hoarseness, Nose Bleed, Oral Ulcers, Seasonal Allergies, Sinus Pain, Sore Throat, Visual Disturbances, Wears glasses/contact lenses  and Yellow Eyes. Respiratory Not Present- Bloody sputum, Chronic Cough, Difficulty Breathing, Snoring and Wheezing. Breast Not Present- Breast Mass, Breast Pain, Nipple Discharge and Skin Changes. Cardiovascular Not Present- Chest Pain, Difficulty Breathing Lying Down, Leg Cramps, Palpitations, Rapid Heart Rate, Shortness of Breath and Swelling of Extremities. Gastrointestinal Not Present- Abdominal Pain, Bloating, Bloody Stool, Change in Bowel Habits, Chronic diarrhea, Constipation, Difficulty Swallowing, Excessive gas, Gets full quickly at meals, Hemorrhoids, Indigestion, Nausea, Rectal Pain and Vomiting. Female Genitourinary Not Present- Frequency, Nocturia, Painful Urination, Pelvic Pain and Urgency. Musculoskeletal Not Present- Back Pain, Joint Pain, Joint Stiffness, Muscle Pain, Muscle Weakness and Swelling of Extremities. Neurological Not Present- Decreased Memory, Fainting, Headaches, Numbness, Seizures, Tingling, Tremor, Trouble walking and Weakness. Psychiatric Not Present- Anxiety, Bipolar, Change in Sleep Pattern, Depression, Fearful and Frequent crying. Endocrine Not Present- Cold Intolerance, Excessive Hunger, Hair Changes, Heat Intolerance, Hot flashes and New Diabetes. Hematology Not Present- Blood Thinners, Easy Bruising, Excessive bleeding, Gland problems, HIV and Persistent Infections. All other systems negative   Physical Exam (Debra Keller A. Debra Ahart MD; 07/25/2019 3:10 PM)  General Mental Status-Alert. General Appearance-Consistent with stated age. Hydration-Well hydrated. Voice-Normal.  Head and Neck Head-normocephalic, atraumatic with no lesions or palpable masses. Trachea-midline. Thyroid Gland Characteristics - normal size and consistency.  Chest and Lung Exam Chest and lung exam reveals -quiet, even and easy respiratory effort with no use of accessory muscles and on auscultation, normal breath sounds, no adventitious sounds and normal vocal  resonance. Inspection Chest Wall - Normal. Back - normal.  Breast Breast - Left-Symmetric, Non Tender, No Biopsy scars, no Dimpling - Left, No Inflammation, No Lumpectomy scars, No Mastectomy scars, No Peau d' Orange. Breast - Right-Symmetric, Non Tender, No Biopsy scars, no Dimpling - Right, No Inflammation, No Lumpectomy scars, No Mastectomy scars, No Peau d' Orange. Breast Lump-No   Palpable Breast Mass.  Cardiovascular Cardiovascular examination reveals -normal heart sounds, regular rate and rhythm with no murmurs and normal pedal pulses bilaterally.  Neurologic Neurologic evaluation reveals -alert and oriented x 3 with no impairment of recent or remote memory. Mental Status-Normal.  Lymphatic Head & Neck  General Head & Neck Lymphatics: Bilateral - Description - Normal. Axillary  General Axillary Region: Bilateral - Description - Normal. Tenderness - Non Tender.    Assessment & Plan (Debra Keller A. Debra Kimbell MD; 07/25/2019 3:12 PM)  BREAST CANCER, LEFT (C50.912) Impression: pt opted for left breast lumpectomy and SLN mapping  Risk of lumpectomy include bleeding, infection, seroma, more surgery, use of seed/wire, wound care, cosmetic deformity and the need for other treatments, death , blood clots, death. Pt agrees to proceed. Risk of sentinel lymph node mapping include bleeding, infection, lymphedema, shoulder pain. stiffness, dye allergy. cosmetic deformity , blood clots, death, need for more surgery. Pt agrees to proceed.   total time 45 min to discuss surgery complications documentation exam and chart reviewed  Current Plans You are being scheduled for surgery- Our schedulers will call you.  You should hear from our office's scheduling department within 5 working days about the location, date, and time of surgery. We try to make accommodations for patient's preferences in scheduling surgery, but sometimes the OR schedule or the surgeon's schedule prevents [place] from  making those accommodations.  If you have not heard from our office [phone]) in 5 working days, call the office and ask for your surgeon's nurse.  If you have other questions about your diagnosis, plan, or surgery, call the office and ask for your surgeon's nurse.  We discussed the staging and pathophysiology of breast cancer. We discussed all of the different options for treatment for breast cancer including surgery, chemotherapy, radiation therapy, Herceptin, and antiestrogen therapy. We discussed a sentinel lymph node biopsy as she does not appear to having lymph node involvement right now. We discussed the performance of that with injection of radioactive tracer and blue dye. We discussed that she would have an incision underneath her axillary hairline. We discussed that there is a bout a 10-20% chance of having a positive node with a sentinel lymph node biopsy and we will await the permanent pathology to make any other first further decisions in terms of her treatment. One of these options might be to return to the operating room to perform an axillary lymph node dissection. We discussed about a 1-2% risk lifetime of chronic shoulder pain as well as lymphedema associated with a sentinel lymph node biopsy. We discussed the options for treatment of the breast cancer which included lumpectomy versus a mastectomy. We discussed the performance of the lumpectomy with a wire placement. We discussed a 10-20% chance of a positive margin requiring reexcision in the operating room. We also discussed that she may need radiation therapy or antiestrogen therapy or both if she undergoes lumpectomy. We discussed the mastectomy and the postoperative care for that as well. We discussed that there is no difference in her survival whether she undergoes lumpectomy with radiation therapy or antiestrogen therapy versus a mastectomy. There is a slight difference in the local recurrence rate being 3-5% with lumpectomy  and about 1% with a mastectomy. We discussed the risks of operation including bleeding, infection, possible reoperation. She understands her further therapy will be based on what her stages at the time of her operation.  Pt Education - [facility] breast cancer surgery: discussed with patient and provided information. Pt  Education - [facility] Breast Biopsy HCI: discussed with patient and provided information. Pt Education - [facility] Breast Cancer Information Given - [employer] "Breast Journey" Package Pt Education - [facility] (After Breast Cancer) Class Info: discussed with patient and provided information.

## 2019-07-25 NOTE — Progress Notes (Signed)
Clinical Social Work [facility]   Clinical Social Worker met with patient and patient's husband in [facility] to assess for distress and other psychosocial needs.  Patient stated she was feeling good after meeting with the treatment team and getting more information on her treatment plan.  Patient has good support from husband as well as extended family.   CSW and patient discussed common feeling and emotions when being diagnosed with cancer, and the importance of support during treatment.  CSW informed patient of the support team and support services at [facility].  CSW provided contact information and encouraged patient to call with any questions or concerns.     [place] , LCSW Clinical Social Worker [facility]

## 2019-07-25 NOTE — Progress Notes (Signed)
Radiation Oncology         [phone] ________________________________  Initial outpatient Consultation,  patient seen in person  Name: Debra Keller MRN: [id number]  Date: 07/25/2019  DOB: September 21, 1940  WC:[id number], Liane Comber, DO  Erroll Luna, MD   REFERRING PHYSICIAN: Erroll Luna, MD  DIAGNOSIS:    ICD-10-CM   1. Malignant neoplasm of upper-inner quadrant of left breast in female, estrogen receptor positive ([place])  C50.212    Z17.0    Cancer Staging Malignant neoplasm of upper-inner quadrant of left breast in female, estrogen receptor positive ([place]) Staging form: Breast, AJCC 8th Edition - Clinical stage from 07/25/2019: Stage IA (cT1a, cN0, cM0, G2, ER+, PR+, HER2-) - Unsigned  CHIEF COMPLAINT: Here to discuss management of left breast cancer  HISTORY OF PRESENT ILLNESS::Debra Keller is a 79 y.o. female who presented with breast abnormality on the following imaging: screening mammogram on the date of 06/20/2019.  Symptoms, if any, at that time, were: none.   Ultrasound of breast on 07/09/2019 revealed 4 mm spiculated mass in left breast at 11:30.   Biopsy on date of 07/18/2019 showed invasive mammary carcinoma, e-cadherin positive.  ER status: 100%; PR status 100%, Her2 status negative; Grade 2.  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 She was subsequently treated with radiation therapy under Dr. Elba Barman and 5 years of tamoxifen.  She is here with her husband.  She is in her usual state of health.  She used to live in [place].  She walks with a cane.  PREVIOUS RADIATION THERAPY: Yes  11/2006: Right Breast (Dr. Elba Barman)  PAST MEDICAL HISTORY:  has a past medical history of AAA (abdominal aortic aneurysm) without rupture ([place]), Balance problem, Bladder tumor, Breast cancer, stage 1, estrogen receptor positive, right [facility]) (oncologist-  dr Jana Hakim), Centrilobular emphysema ([place]), COPD (chronic obstructive pulmonary  disease) ([place]), Dyspnea, Frequency of urination, Full dentures, Hematuria, History of external beam radiation therapy, History of TIAs, Hyperlipidemia, Hypertension, Stroke ([place]) (few yrs agi), and Wears glasses.    PAST SURGICAL HISTORY: Past Surgical History:  Procedure Laterality Date  . ABDOMINAL HYSTERECTOMY  1980s   W/ UNILATERAL SALPINGOOPHORECTOMY  . APPENDECTOMY  many yrs ago  . BREAST LUMPECTOMY WITH NEEDLE LOCALIZATION AND AXILLARY LYMPH NODE DISSECTION Right 10-13-2006   dr [email or web address]  [email or web address]   . SHOULDER OPEN ROTATOR CUFF REPAIR Left early 2000s  . TONSILLECTOMY  child  . TRANSURETHRAL RESECTION OF BLADDER TUMOR N/A 02/03/2018   Procedure: RESTAGE TRANSURETHRAL RESECTION OF BLADDER TUMOR (TURBT);  Surgeon: Lucas Mallow, MD;  Location: [facility];  Service: Urology;  Laterality: N/A;  . TRANSURETHRAL RESECTION OF BLADDER TUMOR WITH MITOMYCIN-C N/A 01/02/2018   Procedure: TRANSURETHRAL RESECTION OF BLADDER TUMOR WITH GEMCITABINE;  Surgeon: Lucas Mallow, MD;  Location[facility];  Service: Urology;  Laterality: N/A;    FAMILY HISTORY: family history is not on file.  SOCIAL HISTORY:  reports that she has been smoking cigarettes. She has a 15.00 pack-year smoking history. She has never used smokeless tobacco. She reports current alcohol use. She reports that she does not use drugs.  ALLERGIES: Penicillins and Morphine and related  MEDICATIONS:  Current Outpatient Medications  Medication Sig Dispense Refill  . aspirin EC 81 MG tablet Take 81 mg by mouth daily.    . Fluticasone-Salmeterol (ADVAIR) 250-50 MCG/DOSE AEPB Inhale 2 puffs into the lungs once.     . hydrochlorothiazide (HYDRODIURIL) 25 MG  tablet Take 25 mg by mouth every morning.     Marland Kitchen HYDROcodone-acetaminophen (NORCO) 5-325 MG tablet Take 1 tablet by mouth every 4 (four) hours as needed for moderate pain. 10 tablet 0  . metoprolol succinate (TOPROL-XL) 100 MG 24 hr tablet Take 100  mg by mouth every morning.      No current facility-administered medications for this encounter.    REVIEW OF SYSTEMS:   As above   PHYSICAL EXAM:  vitals were not taken for this visit.   General: Alert and oriented, in no acute distress MSK: Walks with a cane  Psychiatric: Judgment and insight are intact. Affect is appropriate. Breasts: No obvious masses palpated in the breasts bilaterally or the axillary regions bilaterally.    ECOG = 2  0 - Asymptomatic (Fully active, able to carry on all predisease activities without restriction)  1 - Symptomatic but completely ambulatory (Restricted in physically strenuous activity but ambulatory and able to carry out work of a light or sedentary nature. For example, light housework, office work)  2 - Symptomatic, <50% in bed during the day (Ambulatory and capable of all self care but unable to carry out any work activities. Up and about more than 50% of waking hours)  3 - Symptomatic, >50% in bed, but not bedbound (Capable of only limited self-care, confined to bed or chair 50% or more of waking hours)  4 - Bedbound (Completely disabled. Cannot carry on any self-care. Totally confined to bed or chair)  5 - Death   Eustace Pen MM, Creech RH, Tormey DC, et al. [phone]). "Toxicity and response criteria of the [facility] Group". [place] Oncol. 5 (6): 649-55   LABORATORY DATA:  Lab Results  Component Value Date   WBC 10.6 (H) 07/25/2019   HGB 15.4 (H) 07/25/2019   HCT 47.1 (H) 07/25/2019   MCV 92.0 07/25/2019   PLT 300 07/25/2019   CMP     Component Value Date/Time   NA 141 07/25/2019 1205   NA 142 02/28/2012 1352   K 4.5 07/25/2019 1205   K 3.7 02/28/2012 1352   CL 101 07/25/2019 1205   CL 98 02/28/2012 1352   CO2 31 07/25/2019 1205   CO2 33 (H) 02/28/2012 1352   GLUCOSE 112 (H) 07/25/2019 1205   GLUCOSE 101 (H) 02/28/2012 1352   BUN 23 07/25/2019 1205   BUN 16.0 02/28/2012 1352   CREATININE 0.92 07/25/2019 1205    CREATININE 0.8 02/28/2012 1352   CALCIUM 9.9 07/25/2019 1205   CALCIUM 9.8 02/28/2012 1352   PROT 7.2 07/25/2019 1205   PROT 6.8 02/28/2012 1352   ALBUMIN 3.7 07/25/2019 1205   ALBUMIN 3.5 02/28/2012 1352   AST 16 07/25/2019 1205   AST 14 02/28/2012 1352   ALT 12 07/25/2019 1205   ALT 14 02/28/2012 1352   ALKPHOS 88 07/25/2019 1205   ALKPHOS 75 02/28/2012 1352   BILITOT 0.5 07/25/2019 1205   BILITOT 0.34 02/28/2012 1352   GFRNONAA 60 (L) 07/25/2019 1205   GFRAA >60 07/25/2019 1205         RADIOGRAPHY: US BREAST LTD UNI LEFT INC AXILLA  Result Date: 07/09/2019 CLINICAL DATA:  Possible mass with distortion upper, slightly medial left breast on a recent screening mammogram. The patient recently had her 2nd COVID-19 vaccine in the left arm. The 1st vaccine was also in the left arm. EXAM: DIGITAL DIAGNOSTIC LEFT MAMMOGRAM WITH TOMO ULTRASOUND LEFT BREAST COMPARISON:  Previous exam(s). ACR Breast Density Category b: There  are scattered areas of fibroglandular density. FINDINGS: 3D tomographic and 2D generated spot compression views of the left breast confirm a small, irregular, spiculated mass in the upper, slightly inner aspect of the breast in the middle 3rd. On physical exam, no mass palpable in the left breast or left axilla. Targeted ultrasound is performed, showing a 4 x 4 x 3 mm irregular, hypoechoic mass mild surrounding ill-defined increased echogenicity in the 11:30 o'clock position of the left breast, 7 cm from the nipple. This corresponds to the mammographic mass. Also demonstrated are 3 adjacent left axillary lymph nodes with mild diffuse and eccentric cortical thickening, including a small node without a visible fatty hilum. The other 2 nodes have compressed hila. The maximum cortical thickness is 3.3 mm. Other left axillary lymph nodes have normal, thin cortices. IMPRESSION: 1. 4 mm spiculated mass in the 11:30 o'clock position of the left breast with imaging features highly  suspicious for malignancy. 2. 3 abnormal left axillary lymph nodes. These could represent metastatic lymph nodes or reactive nodes related to the patient's recent COVID-19 vaccine. RECOMMENDATION: 1. Ultrasound-guided core needle biopsy of the 4 mm mass in the 11:30 o'clock position of the left breast. 2. Ultrasound-guided core needle biopsy of 1 of the abnormal left axillary lymph nodes. If the lymph nodes have a more normal appearance when the patient returns for the biopsy, compatible with reactive lymph nodes, the lymph node biopsy may not need to be performed. This has been discussed with the patient. The biopsies have been scheduled at 1:45 p.m. on 07/18/2019. I have discussed the findings and recommendations with the patient. If applicable, a reminder letter will be sent to the patient regarding the next appointment. BI-RADS CATEGORY  5: Highly suggestive of malignancy. Electronically Signed   By: Claudie Revering M.D.   On: 07/09/2019 17:13   MM DIAG BREAST TOMO UNI LEFT  Result Date: 07/09/2019 CLINICAL DATA:  Possible mass with distortion upper, slightly medial left breast on a recent screening mammogram. The patient recently had her 2nd COVID-19 vaccine in the left arm. The 1st vaccine was also in the left arm. EXAM: DIGITAL DIAGNOSTIC LEFT MAMMOGRAM WITH TOMO ULTRASOUND LEFT BREAST COMPARISON:  Previous exam(s). ACR Breast Density Category b: There are scattered areas of fibroglandular density. FINDINGS: 3D tomographic and 2D generated spot compression views of the left breast confirm a small, irregular, spiculated mass in the upper, slightly inner aspect of the breast in the middle 3rd. On physical exam, no mass palpable in the left breast or left axilla. Targeted ultrasound is performed, showing a 4 x 4 x 3 mm irregular, hypoechoic mass mild surrounding ill-defined increased echogenicity in the 11:30 o'clock position of the left breast, 7 cm from the nipple. This corresponds to the mammographic mass.  Also demonstrated are 3 adjacent left axillary lymph nodes with mild diffuse and eccentric cortical thickening, including a small node without a visible fatty hilum. The other 2 nodes have compressed hila. The maximum cortical thickness is 3.3 mm. Other left axillary lymph nodes have normal, thin cortices. IMPRESSION: 1. 4 mm spiculated mass in the 11:30 o'clock position of the left breast with imaging features highly suspicious for malignancy. 2. 3 abnormal left axillary lymph nodes. These could represent metastatic lymph nodes or reactive nodes related to the patient's recent COVID-19 vaccine. RECOMMENDATION: 1. Ultrasound-guided core needle biopsy of the 4 mm mass in the 11:30 o'clock position of the left breast. 2. Ultrasound-guided core needle biopsy of 1 of the abnormal left  axillary lymph nodes. If the lymph nodes have a more normal appearance when the patient returns for the biopsy, compatible with reactive lymph nodes, the lymph node biopsy may not need to be performed. This has been discussed with the patient. The biopsies have been scheduled at 1:45 p.m. on 07/18/2019. I have discussed the findings and recommendations with the patient. If applicable, a reminder letter will be sent to the patient regarding the next appointment. BI-RADS CATEGORY  5: Highly suggestive of malignancy. Electronically Signed   By: Claudie Revering M.D.   On: 07/09/2019 17:13   MM CLIP PLACEMENT LEFT  Result Date: 07/19/2019 CLINICAL DATA:  Patient status post ultrasound-guided core needle biopsy left breast mass. EXAM: DIAGNOSTIC LEFT MAMMOGRAM POST ULTRASOUND BIOPSY COMPARISON:  Previous exam(s). FINDINGS: Mammographic images were obtained following ultrasound guided biopsy of left breast mass 11:30 o'clock. The biopsy marking clip is in expected position at the site of biopsy. IMPRESSION: Appropriate positioning of the ribbon shaped biopsy marking clip at the site of biopsy in the left breast mass 11:30 o'clock. Final  Assessment: Post Procedure Mammograms for Marker Placement Electronically Signed   By: Lovey Newcomer M.D.   On: 07/19/2019 07:59   [place] LT BREAST BX W LOC DEV 1ST LESION IMG BX SPEC US GUIDE  Addendum Date: 07/19/2019   ADDENDUM REPORT: 07/19/2019 13:48 ADDENDUM: Pathology revealed GRADE II INVASIVE MAMMARY CARCINOMA of the LEFT breast, 11:30 o'clock. This was found to be concordant by Dr. Lovey Newcomer. Left axilla was scanned noting a residual thickened lymph node; however, due to the location within the axilla the node was not amenable to percutaneous biopsy. Pathology results were discussed with the patient by telephone. The patient reported doing well after the biopsy with tenderness at the site. Post biopsy instructions and care were reviewed and questions were answered. The patient was encouraged to call [facility] for any additional concerns. The patient was referred to [facility] on July 25, 2019. Pathology results reported by Stacie Acres RN on 07/19/2019. Electronically Signed   By: Lovey Newcomer M.D.   On: 07/19/2019 13:48   Result Date: 07/19/2019 CLINICAL DATA:  Patient presents for ultrasound-guided core needle biopsy of left breast mass 11:30 o'clock. Additionally, on the prior diagnostic evaluation, patient had 3 cortically thickened lymph nodes. Patient had recently undergone left upper extremity COVID-19 vaccination. It was recommended for these nodes to be re-scanned prior to percutaneous biopsy today. EXAM: ULTRASOUND GUIDED LEFT BREAST CORE NEEDLE BIOPSY COMPARISON:  Previous exam(s). PROCEDURE: I met with the patient and we discussed the procedure of ultrasound-guided biopsy, including benefits and alternatives. We discussed the high likelihood of a successful procedure. We discussed the risks of the procedure, including infection, bleeding, tissue injury, clip migration, and inadequate  sampling. Informed written consent was given. The usual time-out protocol was performed immediately prior to the procedure. Lesion quadrant: Upper inner quadrant Using sterile technique and 1% Lidocaine as local anesthetic, under direct ultrasound visualization, a 14 gauge spring-loaded device was used to perform biopsy of left breast mass 11:30 o'clock using a medial approach. At the conclusion of the procedure a ribbon shaped tissue marker clip was deployed into the biopsy cavity. Follow up 2 view mammogram was performed and dictated separately. Note the left axilla was scanned. There was interval decrease in size of at least 1 of the previously identified cortically thickened left axillary lymph nodes. A single residual thickened  lymph node was identified within the left axilla measuring up to 6 mm however this node was just deep and lateral to axillary vasculature (arteries and veins) and due to the location was not amenable to percutaneous biopsy. IMPRESSION: Ultrasound guided biopsy of left breast mass 11:30 o'clock. No apparent complications. Note a few of the lymph nodes previously visualized have decreased in cortical thickening suggestive of a reactive nature which is improving. There was 1 residual cortically thickened left axillary lymph node on ultrasound today however given the location of this node, percutaneous biopsy was not able to be performed given the surrounding vasculature. Electronically Signed: By: Lovey Newcomer M.D. On: 07/18/2019 14:47      IMPRESSION/PLAN: Left Breast Cancer   She is a good candidate for breast conserving surgery.  She anticipates lumpectomy and sentinel lymph node biopsy.  She anticipates taking antiestrogen therapy for years after completion of surgery.  Given the size and relative nonaggressive histology of her breast cancer as well as her age, I think that her prognosis will be excellent with surgery and antiestrogen therapy.  If for some reason her cancer is  more advanced than anticipated upon pathologic staging, we could consider radiotherapy as an extra layer of adjuvant therapy.  Otherwise, it would likely be reasonable for radiotherapy to be held in her situation.  She is enthusiastic about proceeding with the ultimate recommendations of the tumor board.  I let her know that we will review her pathology after her surgery and a referral will be made back to my clinic if radiotherapy is recommended.  She is pleased with this plan.    On date of service, in total, I spent 40 minutes on this encounter.  Patient was seen in person. __________________________________________   Eppie Gibson, MD   This document serves as a record of services personally performed by Eppie Gibson, MD. It was created on her behalf by Wilburn Mylar, a trained medical scribe. The creation of this record is based on the scribe's personal observations and the provider's statements to them. This document has been checked and approved by the attending provider.

## 2019-07-25 NOTE — Therapy (Signed)
[place], [place], [address] Phone: [phone]   Fax:  [phone]  Physical Therapy Evaluation  Patient Details  Name: Debra Keller MRN: [id number] Date of Birth: May 06, 1940 Referring Provider (PT): Dr. Erroll Luna   Encounter Date: 07/25/2019  PT End of Session - 07/25/19 2237    Visit Number  1    Number of Visits  2    Date for PT Re-Evaluation  09/19/19    PT Start Time  1320    PT Stop Time  5361   Also saw pt from 1352-1412 for a total of 34 min   PT Time Calculation (min)  14 min    Activity Tolerance  Patient tolerated treatment well    Behavior During Therapy  [facility] for tasks assessed/performed       Past Medical History:  Diagnosis Date  . AAA (abdominal aortic aneurysm) without rupture [facility])    vascular consult w/ dr Trula Slade 12-19-2017,  measures 2.8cm  . Balance problem    uses cane  . Bladder tumor   . Breast cancer, stage 1, estrogen receptor positive, right [facility]) oncologist-  dr Jana Hakim   dx 07/ 2008----  Stage IA (T1b,N0)  Grade 1,  invasive lobular carcinoma--- s/p  right breast lumpectomy w/ sln dissections 10-13-2006  and completed radiation 10/ 2008,  completed tamoxifen 2016  . Centrilobular emphysema ([place])    per CT 12-07-2017  . COPD (chronic obstructive pulmonary disease) ([place])   . Dyspnea    with exertion  . Frequency of urination   . Full dentures   . Hematuria   . History of external beam radiation therapy    completed 10/ 2008  right breast cancer  . History of TIAs    12-29-2017 per pt never had symptoms, but pcp found per MRI had 3 TIAs,  residual balance issues  . Hyperlipidemia   . Hypertension   . Stroke [facility]) few yrs agi  . Wears glasses     Past Surgical History:  Procedure Laterality Date  . ABDOMINAL HYSTERECTOMY  1980s   W/ UNILATERAL SALPINGOOPHORECTOMY  . APPENDECTOMY  many yrs ago  . BREAST LUMPECTOMY WITH NEEDLE  LOCALIZATION AND AXILLARY LYMPH NODE DISSECTION Right 10-13-2006   dr [email or web address]  [email or web address]   . SHOULDER OPEN ROTATOR CUFF REPAIR Left early 2000s  . TONSILLECTOMY  child  . TRANSURETHRAL RESECTION OF BLADDER TUMOR N/A 02/03/2018   Procedure: RESTAGE TRANSURETHRAL RESECTION OF BLADDER TUMOR (TURBT);  Surgeon: Lucas Mallow, MD;  Location[facility];  Service: Urology;  Laterality: N/A;  . TRANSURETHRAL RESECTION OF BLADDER TUMOR WITH MITOMYCIN-C N/A 01/02/2018   Procedure: TRANSURETHRAL RESECTION OF BLADDER TUMOR WITH GEMCITABINE;  Surgeon: Lucas Mallow, MD;  Location: [facility];  Service: Urology;  Laterality: N/A;    There were no vitals filed for this visit.   Subjective Assessment - 07/25/19 2229    Subjective  Patient reports she is here today to be seen by her medical team for her newly diagnosed left breast cancer.    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 It measures 4 mm and is located in the upper inner quadrant. It is ER/PR positive and HER2 negative with a Ki67 of 1%. 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    Patient Stated Goals  Reduce lymphedema risk and learn post op shoulder ROM HEP    Currently in Pain?  No/denies         [facility] PT Assessment - 07/25/19 0001      Assessment   Medical Diagnosis  Left breast cancer    Referring Provider (PT)  Dr. Marcello Moores Cornett    Onset Date/Surgical Date  06/20/19    Hand Dominance  Right    Prior Therapy  none      Precautions   Precautions  Other (comment)    Precaution Comments  active cancer      Restrictions   Weight Bearing Restrictions  No      Balance Screen   Has the patient fallen in the past 6 months  No    Has the patient  had a decrease in activity level because of a fear of falling?   No    Is the patient reluctant to leave their home because of a fear of falling?   No      Home Film/video editor residence    Living Arrangements  Spouse/significant other    Available Help at Discharge  Family      Prior Function   Level of Independence  Independent with community mobility with device    Vocation  Retired    Leisure  She does not exercise      Cognition   Overall Cognitive Status  Within Functional Limits for tasks assessed      Posture/Postural Control   Posture/Postural Control  Postural limitations    Postural Limitations  Rounded Shoulders;Forward head;Increased thoracic kyphosis      ROM / Strength   AROM / PROM / Strength  AROM;Strength      AROM   Overall AROM Comments  Cervical AROM is WNL    AROM Assessment Site  Shoulder    Right/Left Shoulder  Right;Left    Right Shoulder Extension  56 Degrees    Right Shoulder Flexion  150 Degrees    Right Shoulder ABduction  167 Degrees    Right Shoulder Internal Rotation  80 Degrees    Right Shoulder External Rotation  87 Degrees    Left Shoulder Extension  54 Degrees    Left Shoulder Flexion  151 Degrees    Left Shoulder ABduction  152 Degrees    Left Shoulder Internal Rotation  71 Degrees    Left Shoulder External Rotation  85 Degrees      Strength   Overall Strength  Within functional limits for tasks performed        LYMPHEDEMA/ONCOLOGY QUESTIONNAIRE - 07/25/19 2234      Type   Cancer Type  Left breast cancer      Lymphedema Assessments   Lymphedema Assessments  Upper extremities      Right Upper Extremity Lymphedema   10 cm Proximal to Olecranon Process  26.6 cm    Olecranon Process  21.8 cm    10 cm Proximal to Ulnar Styloid Process  19.8 cm    Just Proximal to Ulnar Styloid Process  14.1 cm    Across Hand at [employer]  17.1 cm    At [place] of 2nd Digit  5.3 cm      Left Upper Extremity  Lymphedema   10 cm Proximal to Olecranon Process  26  cm    Olecranon Process  20.8 cm    10 cm Proximal to Ulnar Styloid Process  18.2 cm    Just Proximal to Ulnar Styloid Process  13.9 cm    Across Hand at [employer]  16.4 cm    At [place] of 2nd Digit  5 cm             Objective measurements completed on examination: See above findings.      Patient was instructed today in a home exercise program today for post op shoulder range of motion. These included active assist shoulder flexion in sitting, scapular retraction, wall walking with shoulder abduction, and hands behind head external rotation.  She was encouraged to do these twice a day, holding 3 seconds and repeating 5 times when permitted by her physician.            PT Education - 07/25/19 2236    Education Details  Lymphedema risk reduction and post op shoulder ROM HEP    Person(s) Educated  Patient;Spouse    Methods  Explanation;Demonstration;Handout    Comprehension  Verbalized understanding;Returned demonstration          PT Long Term Goals - 07/25/19 2241      PT LONG TERM GOAL #1   Title  Patient will demonstrate she has regained full shoulder ROM and function post operatively compared to baselines.    Time  8    Period  Weeks    Status  New    Target Date  09/19/19      Breast Clinic Goals - 07/25/19 2240      Patient will be able to verbalize understanding of pertinent lymphedema risk reduction practices relevant to her diagnosis specifically related to skin care.   Time  1    Period  Days    Status  Achieved      Patient will be able to return demonstrate and/or verbalize understanding of the post-op home exercise program related to regaining shoulder range of motion.   Time  1    Period  Days    Status  Achieved      Patient will be able to verbalize understanding of the importance of attending the postoperative After Breast Cancer Class for further lymphedema risk reduction  education and therapeutic exercise.   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 It measures 4 mm and is located in the upper inner quadrant. It is ER/PR positive and HER2 negative with a Ki67 of 1%. 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 Her multidisciplinary medical team met prior to her assessments to determine a recommended treatment plan. She is planning to have a left lumpectomy and sentinel node biopsy followed by possible radiation and anti-estrogen therapy. She will benefit from a post op PT reassessment to determine needs and also ongoing L-Dex screenings every 3 months for 2 years post op.    Personal Factors and Comorbidities  Comorbidity 1    Comorbidities  Hx multiple TIAs; previous breast cancer right side    Examination-Activity Limitations  Locomotion Level  Stability/Clinical Decision Making  Stable/Uncomplicated    Clinical Decision Making  Low    Rehab Potential  Good    PT Frequency  --   Eval and 1 f/u visit and ongoing L-Dex screenings every 3 months for 2 years post op.   PT Treatment/Interventions  ADLs/Self Care Home Management;Therapeutic exercise;Patient/family education    PT Next Visit Plan  Will reassess 3-4 weeks post op to determine needs    PT Home Exercise Plan  Post op shoulder ROM HEP    Consulted and Agree with Plan of Care  Patient;Family member/caregiver    Family Member Consulted  Husband       Patient will benefit from skilled therapeutic intervention in order to improve the following deficits and impairments:  Postural dysfunction, Decreased range of motion, Decreased knowledge of precautions, Impaired UE functional use, Pain  Visit  Diagnosis: Malignant neoplasm of upper-inner quadrant of left breast in female, estrogen receptor positive ([place]) - Plan: PT plan of care cert/re-cert  Abnormal posture - Plan: PT plan of care cert/re-cert  Difficulty in walking, not elsewhere classified - Plan: PT plan of care cert/re-cert   Patient will follow up at outpatient cancer rehab 3-4 weeks following surgery.  If the patient requires physical therapy at that time, a specific plan will be dictated and sent to the referring physician for approval. The patient was educated today on appropriate basic range of motion exercises to begin post operatively and the importance of attending the After Breast Cancer class following surgery.  Patient was educated today on lymphedema risk reduction practices as it pertains to recommendations that will benefit the patient immediately following surgery.  She verbalized good understanding.    The patient was assessed using the L-Dex machine today to produce a lymphedema index baseline score. The patient will be reassessed on a regular basis (typically every 3 months) to obtain new L-Dex scores. If the score is > 6.5 points away from his/her baseline score indicating onset of subclinical lymphedema, it will be recommended to wear a compression garment for 4 weeks, 12 hours per day and then be reassessed. If the score continues to be > 6.5 points from baseline at reassessment, we will initiate lymphedema treatment. Assessing in this manner has a 95% rate of preventing clinically significant lymphedema.    Problem List Patient Active Problem List   Diagnosis Date Noted  . Stroke due to embolism ([place]) 07/25/2019  . Malignant neoplasm of upper-inner quadrant of left breast in female, estrogen receptor positive ([place]) 07/20/2019   Annia Friendly, PT 07/25/19 10:45 PM  [place], [place], [address] Phone: [phone]   Fax:   [phone]  Name: Debra Keller MRN: [id number] Date of Birth: 05-23-1940

## 2019-07-25 NOTE — H&P (View-Only) (Signed)
Debra Keller Documented: 07/25/2019 8:15 AM Location: [facility] Patient #: [id number] DOB: [birth year] Undefined / Language: Debra Keller / Race: White Female  History of Present Illness Debra Keller A. Debra Mahaffy MD; 07/25/2019 3:10 PM) Patient words: [facility] for left breast cancer dx by SDM. No hx of breast pain mass or discharge. 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 ACR Breast Density Category b: There are scattered areas of fibroglandular density. FINDINGS: 3D tomographic and 2D generated spot compression views of Debra left breast confirm a small, irregular, spiculated mass in Debra upper, slightly inner aspect of Debra breast in Debra middle 3rd. On physical exam, no mass palpable in Debra left breast or left axilla. Targeted ultrasound is performed, showing a 4 x 4 x 3 mm irregular, hypoechoic mass mild surrounding ill-defined increased echogenicity in Debra 11:30 o'clock position of Debra left breast, 7 cm from Debra nipple. This corresponds to Debra mammographic mass. Also demonstrated are 3 adjacent left axillary lymph nodes with mild diffuse and eccentric cortical thickening, including a small node without a visible fatty hilum. Debra other 2 nodes have compressed hila. Debra maximum cortical thickness is 3.3 mm. Other left axillary lymph nodes have normal, thin cortices. IMPRESSION: 1. 4 mm spiculated mass in Debra 11:30 o'clock position of Debra left breast with imaging features highly suspicious for malignancy. 2. 3 abnormal left axillary lymph nodes. These could represent metastatic lymph nodes or reactive nodes related to Debra patient's recent COVID-19 vaccine. RECOMMENDATION: 1. Ultrasound-guided core needle biopsy of Debra 4 mm mass in Debra 11:30 o'clock position of Debra left breast. 2. Ultrasound-guided core needle biopsy of 1  of Debra abnormal left axillary lymph nodes. If Debra lymph nodes have a more normal appearance when Debra patient returns for Debra biopsy, compatible with reactive lymph nodes, Debra lymph node biopsy may not need to be performed. This has been discussed with Debra patient. Debra biopsies have been scheduled at 1:45 p.m. on 07/18/2019. I have discussed Debra findings and recommendations with Debra patient. If applicable, a reminder letter will be sent to Debra patient regarding Debra next appointment. BI-RADS CATEGORY 5: Highly suggestive of malignancy. Electronically Signed By: Debra Keller M.D. On: 07/09/2019 17:13  Result History  MM DIAG BREAST TOMO UNI LEFT (Order #[id number]) on 07/09/2019 - Order Result History Report <[email or web address]>  US BREAST LTD UNI LEFT INC AXILLA (Order [id number]) - Reflex for Order [id number] Study Result  CLINICAL DATA: Possible mass with distortion upper, slightly medial left breast on a recent screening mammogram. Debra patient recently had her 2nd COVID-19 vaccine in Debra left arm. Debra 1st vaccine was also in Debra left arm.                 ADDITIONAL INFORMATION: PROGNOSTIC INDICATORS Results: IMMUNOHISTOCHEMICAL AND MORPHOMETRIC ANALYSIS PERFORMED MANUALLY Debra tumor cells are NEGATIVE for Her2 (1+). Estrogen Receptor: 100%, POSITIVE, STRONG STAINING INTENSITY Progesterone Receptor: 100%, POSITIVE, STRONG STAINING INTENSITY Proliferation Marker Ki67: 1% REFERENCE RANGE ESTROGEN RECEPTOR NEGATIVE 0% POSITIVE =>1% REFERENCE RANGE PROGESTERONE RECEPTOR NEGATIVE 0% POSITIVE =>1% All controls stained appropriately Debra Sheller MD Pathologist, Electronic Signature ( Signed 07/20/2019) E-cadherin is positive consistent with a ductal phenotype. Debra Males MD Pathologist, Electronic Signature ( Signed 07/20/2019) 1 of 3 FINAL for Debra Keller, Debra Keller [phone]) FINAL DIAGNOSIS Diagnosis Breast, left, needle core biopsy, 11:30  o'clock - INVASIVE  MAMMARY CARCINOMA, SEE COMMENT. Microscopic Comment Debra carcinoma appears grade 2 and measures 4 mm in greatest linear extent. E-cadherin will be ordered. Prognostic makers will be ordered. Debra Keller has reviewed Debra case. Debra case was called to Debra Keller on 07/19/2019. Debra Males MD Pathologist, Electronic Signature (Case signed 07/19/2019).  Debra patient is a 79 year old female.   Past Surgical History Debra Slipper, RN; 07/25/2019 8:15 AM) Appendectomy Breast Biopsy Left. Breast Mass; Local Excision Right. Hysterectomy (not due to cancer) - Partial Tonsillectomy  Diagnostic Studies History Debra Slipper, RN; 07/25/2019 8:15 AM) Colonoscopy 1-5 years ago Mammogram within last year Pap Smear >5 years ago  Medication History Debra Slipper, RN; 07/25/2019 8:15 AM) Medications Reconciled  Social History Debra Slipper, RN; 07/25/2019 8:15 AM) Alcohol use Occasional alcohol use. Caffeine use Coffee. No drug use Tobacco use Current every day smoker.  Family History Debra Slipper, RN; 07/25/2019 8:15 AM) Family history unknown First Degree Relatives  Pregnancy / Birth History Debra Slipper, RN; 07/25/2019 8:15 AM) Age at menarche 35 years. Age of menopause 51-60 Gravida 2 Maternal age 52-20 Para 2 Regular periods  Other Problems Debra Slipper, RN; 07/25/2019 8:15 AM) Chronic Obstructive Lung Disease High blood pressure Myocardial infarction     Review of Systems (Debra Keller A. Debra Lichter MD; 07/25/2019 3:09 PM) General Not Present- Appetite Loss, Chills, Fatigue, Fever, Night Sweats, Weight Gain and Weight Loss. Skin Not Present- Change in Wart/Mole, Dryness, Hives, Jaundice, New Lesions, Non-Healing Wounds, Rash and Ulcer. HEENT Present- Ringing in Debra Ears. Not Present- Earache, Hearing Loss, Hoarseness, Nose Bleed, Oral Ulcers, Seasonal Allergies, Sinus Pain, Sore Throat, Visual Disturbances, Wears glasses/contact lenses  and Yellow Eyes. Respiratory Not Present- Bloody sputum, Chronic Cough, Difficulty Breathing, Snoring and Wheezing. Breast Not Present- Breast Mass, Breast Pain, Nipple Discharge and Skin Changes. Cardiovascular Not Present- Chest Pain, Difficulty Breathing Lying Down, Leg Cramps, Palpitations, Rapid Heart Rate, Shortness of Breath and Swelling of Extremities. Gastrointestinal Not Present- Abdominal Pain, Bloating, Bloody Stool, Change in Bowel Habits, Chronic diarrhea, Constipation, Difficulty Swallowing, Excessive gas, Gets full quickly at meals, Hemorrhoids, Indigestion, Nausea, Rectal Pain and Vomiting. Female Genitourinary Not Present- Frequency, Nocturia, Painful Urination, Pelvic Pain and Urgency. Musculoskeletal Not Present- Back Pain, Joint Pain, Joint Stiffness, Muscle Pain, Muscle Weakness and Swelling of Extremities. Neurological Not Present- Decreased Memory, Fainting, Headaches, Numbness, Seizures, Tingling, Tremor, Trouble walking and Weakness. Psychiatric Not Present- Anxiety, Bipolar, Change in Sleep Pattern, Depression, Fearful and Frequent crying. Endocrine Not Present- Cold Intolerance, Excessive Hunger, Hair Changes, Heat Intolerance, Hot flashes and New Diabetes. Hematology Not Present- Blood Thinners, Easy Bruising, Excessive bleeding, Gland problems, HIV and Persistent Infections. All other systems negative   Physical Exam (Ryla Cauthon A. Dareon Nunziato MD; 07/25/2019 3:10 PM)  General Mental Status-Alert. General Appearance-Consistent with stated age. Hydration-Well hydrated. Voice-Normal.  Head and Neck Head-normocephalic, atraumatic with no lesions or palpable masses. Trachea-midline. Thyroid Gland Characteristics - normal size and consistency.  Chest and Lung Exam Chest and lung exam reveals -quiet, even and easy respiratory effort with no use of accessory muscles and on auscultation, normal breath sounds, no adventitious sounds and normal vocal  resonance. Inspection Chest Wall - Normal. Back - normal.  Breast Breast - Left-Symmetric, Non Tender, No Biopsy scars, no Dimpling - Left, No Inflammation, No Lumpectomy scars, No Mastectomy scars, No Peau d' Orange. Breast - Right-Symmetric, Non Tender, No Biopsy scars, no Dimpling - Right, No Inflammation, No Lumpectomy scars, No Mastectomy scars, No Peau d' Orange. Breast Lump-No  Palpable Breast Mass.  Cardiovascular Cardiovascular examination reveals -normal heart sounds, regular rate and rhythm with no murmurs and normal pedal pulses bilaterally.  Neurologic Neurologic evaluation reveals -alert and oriented x 3 with no impairment of recent or remote memory. Mental Status-Normal.  Lymphatic Head & Neck  General Head & Neck Lymphatics: Bilateral - Description - Normal. Axillary  General Axillary Region: Bilateral - Description - Normal. Tenderness - Non Tender.    Assessment & Plan (Inas Avena A. Mahamud Metts MD; 07/25/2019 3:12 PM)  BREAST CANCER, LEFT (C50.912) Impression: pt opted for left breast lumpectomy and SLN mapping  Risk of lumpectomy include bleeding, infection, seroma, more surgery, use of seed/wire, wound care, cosmetic deformity and Debra need for other treatments, death , blood clots, death. Pt agrees to proceed. Risk of sentinel lymph node mapping include bleeding, infection, lymphedema, shoulder pain. stiffness, dye allergy. cosmetic deformity , blood clots, death, need for more surgery. Pt agrees to proceed.   total time 45 min to discuss surgery complications documentation exam and chart reviewed  Current Plans You are being scheduled for surgery- Our schedulers will call you.  You should hear from our office's scheduling department within 5 working days about Debra location, date, and time of surgery. We try to make accommodations for patient's preferences in scheduling surgery, but sometimes Debra OR schedule or Debra surgeon's schedule prevents [place] from  making those accommodations.  If you have not heard from our office [phone]) in 5 working days, call Debra office and ask for your surgeon's nurse.  If you have other questions about your diagnosis, plan, or surgery, call Debra office and ask for your surgeon's nurse.  We discussed Debra staging and pathophysiology of breast cancer. We discussed all of Debra different options for treatment for breast cancer including surgery, chemotherapy, radiation therapy, Herceptin, and antiestrogen therapy. We discussed a sentinel lymph node biopsy as she does not appear to having lymph node involvement right now. We discussed Debra performance of that with injection of radioactive tracer and blue dye. We discussed that she would have an incision underneath her axillary hairline. We discussed that there is a bout a 10-20% chance of having a positive node with a sentinel lymph node biopsy and we will await Debra permanent pathology to make any other first further decisions in terms of her treatment. One of these options might be to return to Debra operating room to perform an axillary lymph node dissection. We discussed about a 1-2% risk lifetime of chronic shoulder pain as well as lymphedema associated with a sentinel lymph node biopsy. We discussed Debra options for treatment of Debra breast cancer which included lumpectomy versus a mastectomy. We discussed Debra performance of Debra lumpectomy with a wire placement. We discussed a 10-20% chance of a positive margin requiring reexcision in Debra operating room. We also discussed that she may need radiation therapy or antiestrogen therapy or both if she undergoes lumpectomy. We discussed Debra mastectomy and Debra postoperative care for that as well. We discussed that there is no difference in her survival whether she undergoes lumpectomy with radiation therapy or antiestrogen therapy versus a mastectomy. There is a slight difference in Debra local recurrence rate being 3-5% with lumpectomy  and about 1% with a mastectomy. We discussed Debra risks of operation including bleeding, infection, possible reoperation. She understands her further therapy will be based on what her stages at Debra time of her operation.  Pt Education - [facility] breast cancer surgery: discussed with patient and provided information. Pt  Education - [facility] Breast Biopsy HCI: discussed with patient and provided information. Pt Education - [facility] Breast Cancer Information Given - [employer] "Breast Journey" Package Pt Education - [facility] (After Breast Cancer) Class Info: discussed with patient and provided information.

## 2019-07-25 NOTE — Patient Instructions (Signed)

## 2019-07-26 ENCOUNTER — Telehealth: Payer: Self-pay | Admitting: Oncology

## 2019-07-26 ENCOUNTER — Other Ambulatory Visit: Payer: Self-pay | Admitting: Surgery

## 2019-07-26 DIAGNOSIS — Z170 Estrogen receptor positive status [ER+]: Secondary | ICD-10-CM

## 2019-07-26 DIAGNOSIS — C50912 Malignant neoplasm of unspecified site of left female breast: Secondary | ICD-10-CM

## 2019-07-26 NOTE — Telephone Encounter (Signed)
Scheduled appt per 4/21 los. Pt confirmed appt date and time.

## 2019-08-02 ENCOUNTER — Encounter: Payer: Self-pay | Admitting: *Deleted

## 2019-08-02 NOTE — Progress Notes (Signed)
Left message to follow up from [facility] and assess navigation needs.   

## 2019-08-03 ENCOUNTER — Encounter: Payer: Self-pay | Admitting: Nutrition

## 2019-08-03 NOTE — Progress Notes (Signed)
Chart reviewed. Patient with newly diagnosed breast cancer with plans for surgery, adjuvant radiation and antiestrogens. Weight WNL. No current nutrition needs identified. Patient given a nutrition packet at clinic with name and phone number of RD for questions.

## 2019-08-10 ENCOUNTER — Encounter (HOSPITAL_BASED_OUTPATIENT_CLINIC_OR_DEPARTMENT_OTHER): Payer: Self-pay | Admitting: Surgery

## 2019-08-10 ENCOUNTER — Other Ambulatory Visit: Payer: Self-pay

## 2019-08-13 ENCOUNTER — Other Ambulatory Visit (HOSPITAL_COMMUNITY)
Admission: RE | Admit: 2019-08-13 | Discharge: 2019-08-13 | Disposition: A | Discharge status: Home / Self Care | Payer: Medicare Other | Source: Ambulatory Visit | Attending: Surgery | Admitting: Surgery

## 2019-08-13 ENCOUNTER — Other Ambulatory Visit (HOSPITAL_COMMUNITY): Payer: Medicare Other

## 2019-08-13 DIAGNOSIS — Z20822 Contact with and (suspected) exposure to covid-19: Secondary | ICD-10-CM | POA: Diagnosis not present

## 2019-08-13 DIAGNOSIS — Z01812 Encounter for preprocedural laboratory examination: Secondary | ICD-10-CM | POA: Insufficient documentation

## 2019-08-13 LAB — SARS CORONAVIRUS 2 (TAT 6-24 HRS): SARS Coronavirus 2: NEGATIVE

## 2019-08-14 ENCOUNTER — Encounter (HOSPITAL_BASED_OUTPATIENT_CLINIC_OR_DEPARTMENT_OTHER)
Admission: RE | Admit: 2019-08-14 | Discharge: 2019-08-14 | Disposition: A | Discharge status: Home / Self Care | Payer: Medicare Other | Source: Ambulatory Visit | Attending: Surgery | Admitting: Surgery

## 2019-08-14 DIAGNOSIS — Z01812 Encounter for preprocedural laboratory examination: Secondary | ICD-10-CM | POA: Diagnosis not present

## 2019-08-14 LAB — CBC WITH DIFFERENTIAL/PLATELET
Abs Immature Granulocytes: 0.03 10*3/uL (ref 0.00–0.07)
Basophils Absolute: 0 10*3/uL (ref 0.0–0.1)
Basophils Relative: 0 %
Eosinophils Absolute: 0.2 10*3/uL (ref 0.0–0.5)
Eosinophils Relative: 2 %
HCT: 44.6 % (ref 36.0–46.0)
Hemoglobin: 14.8 g/dL (ref 12.0–15.0)
Immature Granulocytes: 0 %
Lymphocytes Relative: 28 %
Lymphs Abs: 2.8 10*3/uL (ref 0.7–4.0)
MCH: 30 pg (ref 26.0–34.0)
MCHC: 33.2 g/dL (ref 30.0–36.0)
MCV: 90.3 fL (ref 80.0–100.0)
Monocytes Absolute: 0.7 10*3/uL (ref 0.1–1.0)
Monocytes Relative: 7 %
Neutro Abs: 6.4 10*3/uL (ref 1.7–7.7)
Neutrophils Relative %: 63 %
Platelets: 276 10*3/uL (ref 150–400)
RBC: 4.94 MIL/uL (ref 3.87–5.11)
RDW: 14.4 % (ref 11.5–15.5)
WBC: 10.1 10*3/uL (ref 4.0–10.5)
nRBC: 0 % (ref 0.0–0.2)

## 2019-08-14 LAB — COMPREHENSIVE METABOLIC PANEL
ALT: 13 U/L (ref 0–44)
AST: 15 U/L (ref 15–41)
Albumin: 3.5 g/dL (ref 3.5–5.0)
Alkaline Phosphatase: 69 U/L (ref 38–126)
Anion gap: 12 (ref 5–15)
BUN: 13 mg/dL (ref 8–23)
CO2: 25 mmol/L (ref 22–32)
Calcium: 9 mg/dL (ref 8.9–10.3)
Chloride: 104 mmol/L (ref 98–111)
Creatinine, Ser: 0.73 mg/dL (ref 0.44–1.00)
GFR calc Af Amer: >60 mL/min (ref >60)
GFR calc non Af Amer: >60 mL/min (ref >60)
Glucose, Bld: 96 mg/dL (ref 70–99)
Potassium: 3.6 mmol/L (ref 3.5–5.1)
Sodium: 141 mmol/L (ref 135–145)
Total Bilirubin: 0.8 mg/dL (ref 0.3–1.2)
Total Protein: 6.4 g/dL — ABNORMAL LOW (ref 6.5–8.1)

## 2019-08-14 NOTE — Progress Notes (Signed)
      Enhanced Recovery after Surgery for Orthopedics Enhanced Recovery after Surgery is a protocol used to improve the stress on your body and your recovery after surgery.  Patient Instructions  . The night before surgery:  o No food after midnight. ONLY clear liquids after midnight  . The day of surgery (if you do NOT have diabetes):  o Drink ONE (1) Pre-Surgery Clear Ensure as directed.   o This drink was given to you during your hospital  pre-op appointment visit. o The pre-op nurse will instruct you on the time to drink the  Pre-Surgery Ensure depending on your surgery time. o Finish the drink at the designated time by the pre-op nurse.  o Nothing else to drink after completing the  Pre-Surgery Clear Ensure.  . The day of surgery (if you have diabetes): o Drink ONE (1) Gatorade 2 (G2) as directed. o This drink was given to you during your hospital  pre-op appointment visit.  o The pre-op nurse will instruct you on the time to drink the   Gatorade 2 (G2) depending on your surgery time. o Color of the Gatorade may vary. Red is not allowed. o Nothing else to drink after completing the  Gatorade 2 (G2).         If you have questions, please contact your surgeon's office.   Body wash -CHG given to pt with instructions. Pt verbalized understanding

## 2019-08-15 ENCOUNTER — Ambulatory Visit
Admission: RE | Admit: 2019-08-15 | Discharge: 2019-08-15 | Disposition: A | Discharge status: Home / Self Care | Payer: Medicare Other | Source: Ambulatory Visit | Attending: Surgery | Admitting: Surgery

## 2019-08-15 ENCOUNTER — Other Ambulatory Visit: Payer: Self-pay

## 2019-08-15 DIAGNOSIS — C50912 Malignant neoplasm of unspecified site of left female breast: Secondary | ICD-10-CM

## 2019-08-16 ENCOUNTER — Ambulatory Visit (HOSPITAL_BASED_OUTPATIENT_CLINIC_OR_DEPARTMENT_OTHER)
Admission: RE | Admit: 2019-08-16 | Discharge: 2019-08-16 | Disposition: A | Discharge status: Home / Self Care | Payer: Medicare Other | Attending: Surgery | Admitting: Surgery

## 2019-08-16 ENCOUNTER — Ambulatory Visit (HOSPITAL_BASED_OUTPATIENT_CLINIC_OR_DEPARTMENT_OTHER): Payer: Medicare Other | Admitting: Certified Registered"

## 2019-08-16 ENCOUNTER — Encounter (HOSPITAL_BASED_OUTPATIENT_CLINIC_OR_DEPARTMENT_OTHER)
Admission: RE | Disposition: A | Discharge status: Home / Self Care | Payer: Self-pay | Source: Home / Self Care | Attending: Surgery

## 2019-08-16 ENCOUNTER — Encounter (HOSPITAL_COMMUNITY)
Admission: RE | Admit: 2019-08-16 | Discharge: 2019-08-16 | Disposition: A | Discharge status: Home / Self Care | Payer: Medicare Other | Source: Ambulatory Visit | Attending: Surgery | Admitting: Surgery

## 2019-08-16 ENCOUNTER — Encounter (HOSPITAL_BASED_OUTPATIENT_CLINIC_OR_DEPARTMENT_OTHER): Payer: Self-pay | Admitting: Surgery

## 2019-08-16 ENCOUNTER — Other Ambulatory Visit: Payer: Self-pay

## 2019-08-16 ENCOUNTER — Ambulatory Visit
Admission: RE | Admit: 2019-08-16 | Discharge: 2019-08-16 | Disposition: A | Discharge status: Home / Self Care | Payer: Medicare Other | Source: Ambulatory Visit | Attending: Surgery | Admitting: Surgery

## 2019-08-16 DIAGNOSIS — C50412 Malignant neoplasm of upper-outer quadrant of left female breast: Secondary | ICD-10-CM | POA: Diagnosis not present

## 2019-08-16 DIAGNOSIS — I252 Old myocardial infarction: Secondary | ICD-10-CM | POA: Diagnosis not present

## 2019-08-16 DIAGNOSIS — F17200 Nicotine dependence, unspecified, uncomplicated: Secondary | ICD-10-CM | POA: Diagnosis not present

## 2019-08-16 DIAGNOSIS — R591 Generalized enlarged lymph nodes: Secondary | ICD-10-CM | POA: Insufficient documentation

## 2019-08-16 DIAGNOSIS — I10 Essential (primary) hypertension: Secondary | ICD-10-CM | POA: Diagnosis not present

## 2019-08-16 DIAGNOSIS — J449 Chronic obstructive pulmonary disease, unspecified: Secondary | ICD-10-CM | POA: Diagnosis not present

## 2019-08-16 DIAGNOSIS — Z170 Estrogen receptor positive status [ER+]: Secondary | ICD-10-CM

## 2019-08-16 DIAGNOSIS — C50912 Malignant neoplasm of unspecified site of left female breast: Secondary | ICD-10-CM

## 2019-08-16 HISTORY — PX: BREAST LUMPECTOMY WITH RADIOACTIVE SEED AND SENTINEL LYMPH NODE BIOPSY: SHX6550

## 2019-08-16 HISTORY — PX: BREAST LUMPECTOMY: SHX2

## 2019-08-16 SURGERY — BREAST LUMPECTOMY WITH RADIOACTIVE SEED AND SENTINEL LYMPH NODE BIOPSY
Anesthesia: General | Site: Breast | Laterality: Left

## 2019-08-16 MED ORDER — FENTANYL CITRATE (PF) 100 MCG/2ML IJ SOLN
INTRAMUSCULAR | Status: AC
  Filled 2019-08-16: qty 2

## 2019-08-16 MED ORDER — CLINDAMYCIN PHOSPHATE 900 MG/50ML IV SOLN
900.0000 mg | INTRAVENOUS | Status: AC
  Administered 2019-08-16: 900 mg via INTRAVENOUS

## 2019-08-16 MED ORDER — ONDANSETRON HCL 4 MG/2ML IJ SOLN
INTRAMUSCULAR | Status: DC | PRN
  Administered 2019-08-16: 4 mg via INTRAVENOUS

## 2019-08-16 MED ORDER — CHLORHEXIDINE GLUCONATE CLOTH 2 % EX PADS: 6.0000 | MEDICATED_PAD | Freq: Once | CUTANEOUS | Status: DC

## 2019-08-16 MED ORDER — MIDAZOLAM HCL 2 MG/2ML IJ SOLN: 1.0000 mg | INTRAMUSCULAR | Status: DC | PRN

## 2019-08-16 MED ORDER — LACTATED RINGERS IV SOLN: INTRAVENOUS | Status: DC

## 2019-08-16 MED ORDER — LIDOCAINE 2% (20 MG/ML) 5 ML SYRINGE
INTRAMUSCULAR | Status: DC | PRN
  Administered 2019-08-16: 60 mg via INTRAVENOUS

## 2019-08-16 MED ORDER — PROPOFOL 10 MG/ML IV BOLUS
INTRAVENOUS | Status: AC
  Filled 2019-08-16: qty 20

## 2019-08-16 MED ORDER — CLINDAMYCIN PHOSPHATE 900 MG/50ML IV SOLN
INTRAVENOUS | Status: AC
  Filled 2019-08-16: qty 50

## 2019-08-16 MED ORDER — BUPIVACAINE HCL (PF) 0.5 % IJ SOLN
INTRAMUSCULAR | Status: DC | PRN
  Administered 2019-08-16: 20 mL via PERINEURAL

## 2019-08-16 MED ORDER — ACETAMINOPHEN 500 MG PO TABS
ORAL_TABLET | ORAL | Status: AC
  Filled 2019-08-16: qty 1

## 2019-08-16 MED ORDER — METHYLENE BLUE 0.5 % INJ SOLN
INTRAVENOUS | Status: AC
  Filled 2019-08-16: qty 10

## 2019-08-16 MED ORDER — ONDANSETRON HCL 4 MG/2ML IJ SOLN: 4.0000 mg | Freq: Four times a day (QID) | INTRAMUSCULAR | Status: DC | PRN

## 2019-08-16 MED ORDER — LIDOCAINE 2% (20 MG/ML) 5 ML SYRINGE
INTRAMUSCULAR | Status: AC
  Filled 2019-08-16: qty 5

## 2019-08-16 MED ORDER — MIDAZOLAM HCL 2 MG/2ML IJ SOLN
INTRAMUSCULAR | Status: AC
  Filled 2019-08-16: qty 2

## 2019-08-16 MED ORDER — BUPIVACAINE HCL (PF) 0.25 % IJ SOLN
INTRAMUSCULAR | Status: AC
  Filled 2019-08-16: qty 30

## 2019-08-16 MED ORDER — EPHEDRINE SULFATE-NACL 50-0.9 MG/10ML-% IV SOSY
PREFILLED_SYRINGE | INTRAVENOUS | Status: DC | PRN
  Administered 2019-08-16: 10 mg via INTRAVENOUS
  Administered 2019-08-16: 5 mg via INTRAVENOUS

## 2019-08-16 MED ORDER — DEXAMETHASONE SODIUM PHOSPHATE 10 MG/ML IJ SOLN
INTRAMUSCULAR | Status: DC | PRN
  Administered 2019-08-16: 8 mg via INTRAVENOUS

## 2019-08-16 MED ORDER — OXYCODONE HCL 5 MG/5ML PO SOLN: 5.0000 mg | Freq: Once | ORAL | Status: DC | PRN

## 2019-08-16 MED ORDER — PROPOFOL 10 MG/ML IV BOLUS
INTRAVENOUS | Status: DC | PRN
  Administered 2019-08-16: 130 mg via INTRAVENOUS

## 2019-08-16 MED ORDER — BUPIVACAINE HCL (PF) 0.25 % IJ SOLN
INTRAMUSCULAR | Status: DC | PRN
Start: 2019-08-16 — End: 2019-08-16
  Administered 2019-08-16: 22 mL

## 2019-08-16 MED ORDER — FENTANYL CITRATE (PF) 100 MCG/2ML IJ SOLN: 25.0000 ug | INTRAMUSCULAR | Status: DC | PRN

## 2019-08-16 MED ORDER — FENTANYL CITRATE (PF) 100 MCG/2ML IJ SOLN
50.0000 ug | INTRAMUSCULAR | Status: DC | PRN
  Administered 2019-08-16 (×2): 50 ug via INTRAVENOUS

## 2019-08-16 MED ORDER — ACETAMINOPHEN 500 MG PO TABS
1000.0000 mg | ORAL_TABLET | ORAL | Status: AC
  Administered 2019-08-16: 1000 mg via ORAL

## 2019-08-16 MED ORDER — OXYCODONE HCL 5 MG PO TABS: 5.0000 mg | ORAL_TABLET | Freq: Once | ORAL | Status: DC | PRN

## 2019-08-16 MED ORDER — SODIUM CHLORIDE (PF) 0.9 % IJ SOLN
INTRAMUSCULAR | Status: AC
  Filled 2019-08-16: qty 10

## 2019-08-16 MED ORDER — ONDANSETRON HCL 4 MG/2ML IJ SOLN
INTRAMUSCULAR | Status: AC
  Filled 2019-08-16: qty 2

## 2019-08-16 MED ORDER — SUCCINYLCHOLINE CHLORIDE 200 MG/10ML IV SOSY
PREFILLED_SYRINGE | INTRAVENOUS | Status: AC
  Filled 2019-08-16: qty 10

## 2019-08-16 MED ORDER — DEXAMETHASONE SODIUM PHOSPHATE 10 MG/ML IJ SOLN
INTRAMUSCULAR | Status: AC
  Filled 2019-08-16: qty 1

## 2019-08-16 MED ORDER — HYDROCODONE-ACETAMINOPHEN 5-325 MG PO TABS
1.0000 | ORAL_TABLET | Freq: Four times a day (QID) | ORAL | 0 refills | Status: DC | PRN
Start: 2019-08-16 — End: 2019-09-19

## 2019-08-16 MED ORDER — LIDOCAINE-EPINEPHRINE 1 %-1:100000 IJ SOLN
INTRAMUSCULAR | Status: AC
  Filled 2019-08-16: qty 1

## 2019-08-16 SURGICAL SUPPLY — 51 items
ADH SKN CLS APL DERMABOND .7 (GAUZE/BANDAGES/DRESSINGS) ×1
APL PRP STRL LF DISP 70% ISPRP (MISCELLANEOUS) ×1
APPLIER CLIP 9.375 MED OPEN (MISCELLANEOUS) ×3
APR CLP MED 9.3 20 MLT OPN (MISCELLANEOUS) ×1
BINDER BREAST LRG (GAUZE/BANDAGES/DRESSINGS) IMPLANT
BINDER BREAST MEDIUM (GAUZE/BANDAGES/DRESSINGS) IMPLANT
BINDER BREAST XLRG (GAUZE/BANDAGES/DRESSINGS) IMPLANT
BINDER BREAST XXLRG (GAUZE/BANDAGES/DRESSINGS) ×2 IMPLANT
BLADE SURG 15 STRL LF DISP TIS (BLADE) ×1 IMPLANT
BLADE SURG 15 STRL SS (BLADE) ×3
CANISTER SUC SOCK COL 7IN (MISCELLANEOUS) IMPLANT
CANISTER SUCT 1200ML W/VALVE (MISCELLANEOUS) ×3 IMPLANT
CHLORAPREP W/TINT 26 (MISCELLANEOUS) ×3 IMPLANT
CLIP APPLIE 9.375 MED OPEN (MISCELLANEOUS) ×1 IMPLANT
COVER BACK TABLE 60X90IN (DRAPES) ×3 IMPLANT
COVER MAYO STAND STRL (DRAPES) ×3 IMPLANT
COVER PROBE W GEL 5X96 (DRAPES) ×3 IMPLANT
COVER WAND RF STERILE (DRAPES) IMPLANT
DECANTER SPIKE VIAL GLASS SM (MISCELLANEOUS) ×2 IMPLANT
DERMABOND ADVANCED (GAUZE/BANDAGES/DRESSINGS) ×2
DERMABOND ADVANCED .7 DNX12 (GAUZE/BANDAGES/DRESSINGS) ×1 IMPLANT
DRAPE LAPAROSCOPIC ABDOMINAL (DRAPES) ×3 IMPLANT
DRAPE UTILITY XL STRL (DRAPES) ×3 IMPLANT
ELECT COATED BLADE 2.86 ST (ELECTRODE) ×3 IMPLANT
ELECT REM PT RETURN 9FT ADLT (ELECTROSURGICAL) ×3
ELECTRODE REM PT RTRN 9FT ADLT (ELECTROSURGICAL) ×1 IMPLANT
GLOVE BIOGEL PI IND STRL 8 (GLOVE) ×1 IMPLANT
GLOVE BIOGEL PI INDICATOR 8 (GLOVE) ×2
GLOVE ECLIPSE 8.0 STRL XLNG CF (GLOVE) ×3 IMPLANT
GOWN STRL REUS W/ TWL LRG LVL3 (GOWN DISPOSABLE) ×2 IMPLANT
GOWN STRL REUS W/TWL LRG LVL3 (GOWN DISPOSABLE) ×6
HEMOSTAT ARISTA ABSORB 3G PWDR (HEMOSTASIS) IMPLANT
HEMOSTAT SNOW SURGICEL 2X4 (HEMOSTASIS) IMPLANT
KIT MARKER MARGIN INK (KITS) ×3 IMPLANT
NDL HYPO 25X1 1.5 SAFETY (NEEDLE) ×1 IMPLANT
NDL SAFETY ECLIPSE 18X1.5 (NEEDLE) IMPLANT
NEEDLE HYPO 18GX1.5 SHARP (NEEDLE)
NEEDLE HYPO 25X1 1.5 SAFETY (NEEDLE) ×3 IMPLANT
NS IRRIG 1000ML POUR BTL (IV SOLUTION) ×3 IMPLANT
PENCIL SMOKE EVACUATOR (MISCELLANEOUS) ×3 IMPLANT
SET BASIN DAY SURGERY F.S. (CUSTOM PROCEDURE TRAY) ×3 IMPLANT
SLEEVE SCD COMPRESS KNEE MED (MISCELLANEOUS) ×3 IMPLANT
SPONGE LAP 4X18 RFD (DISPOSABLE) ×5 IMPLANT
SUT MNCRL AB 4-0 PS2 18 (SUTURE) ×3 IMPLANT
SUT VICRYL 3-0 CR8 SH (SUTURE) ×3 IMPLANT
SYR CONTROL 10ML LL (SYRINGE) ×3 IMPLANT
TOWEL GREEN STERILE FF (TOWEL DISPOSABLE) ×3 IMPLANT
TRAY FAXITRON CT DISP (TRAY / TRAY PROCEDURE) ×3 IMPLANT
TUBE CONNECTING 20'X1/4 (TUBING) ×1
TUBE CONNECTING 20X1/4 (TUBING) ×2 IMPLANT
YANKAUER SUCT BULB TIP NO VENT (SUCTIONS) ×3 IMPLANT

## 2019-08-16 NOTE — Anesthesia Procedure Notes (Signed)
Anesthesia Regional Block: Pectoralis block   Pre-Anesthetic Checklist: ,, timeout performed, Correct Patient, Correct Site, Correct Laterality, Correct Procedure, Correct Position, site marked, Risks and benefits discussed,  Surgical consent,  Pre-op evaluation,  At surgeon's request and post-op pain management  Laterality: Left  Prep: chloraprep       Needles:  Injection technique: Single-shot  Needle Type: Echogenic Needle     Needle Length: 9cm  Needle Gauge: 21     Additional Needles:   Narrative:  Start time: 08/16/2019 7:12 AM End time: 08/16/2019 7:17 AM Injection made incrementally with aspirations every 5 mL.  Performed by: Personally  Anesthesiologist: Albertha Ghee, MD  Additional Notes: Pt tolerated the procedure well.

## 2019-08-16 NOTE — Op Note (Signed)
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  She opted for breast conserving surgery.  On her preop evaluation she had some mild lymphadenopathy therefore sentinel lymph node mapping was recommended for the small low-grade tumor.The procedure has been discussed with the patient. Alternatives to surgery have been discussed with the patient.  Risks of surgery include bleeding,  Infection,  Seroma formation, death,  and the need for further surgery.   The patient understands and wishes to proceed.Sentinel lymph node mapping and dissection has been discussed with the patient.  Risk of bleeding,  Infection,  Seroma formation,  Additional procedures,,  Shoulder weakness ,  Shoulder stiffness,  Nerve and blood vessel injury and reaction to the mapping dyes have been discussed.  Alternatives to surgery have been discussed with the patient.  The patient agrees to proceed.  Description of procedure: The patient was met in the holding area and questions were answered.  She underwent pectoral block on the left per anesthesia protocol.  She underwent injection of left breast with technetium sulfur colloid.  Neoprobe used to verify seed location left breast upper outer quadrant.  Films were available for review.  All questions were answered and she proceeded to the operating room.  She was placed upon the OR table.  Induction general anesthesia left breast was prepped draped sterile fashion  timeout was performed.  Timeout was performed.  Neoprobe used to verify seed location left breast upper outer quadrant.  Incision made in this area after infiltration of the skin with local anesthetic.  All tissue around the seed and clip were excised with a grossly negative margin.  Faxitron revealed the seed and clip to be in the specimen.  The cavities made hemostatic.  Clips used to mark the cavity.  Cavity closed with 3-0 Vicryl and 4-0 Monocryl.  The neoprobe was changed to technetium settings for sentinel lymph node mapping.  Hotspot identified in left axilla.  3 cm incision made in the left axillary region.  Dissection carried down into the level 1 deep axillary contents.  3 hot nodes were removed with background counts approaching 0.  Hemostasis achieved.  The long thoracic nerve, thoracodorsal trunk and axillary vein were all preserved.  Wound made hemostatic with cautery.  Wound closed with 3-0 Vicryl and 4-0 Monocryl.  Dermabond placed on both incisions.  Breast binder placed.  All counts were found to be correct.  Patient was then awoke extubated taken to recovery in satisfactory condition.

## 2019-08-16 NOTE — Transfer of Care (Signed)
Immediate Anesthesia Transfer of Care Note  Patient: Debra Keller  Procedure(s) Performed: BREAST LUMPECTOMY WITH RADIOACTIVE SEED AND SENTINEL LYMPH NODE BIOPSY (Left Breast)  Patient Location: PACU  Anesthesia Type:General  Level of Consciousness: awake, alert  and oriented  Airway & Oxygen Therapy: Patient Spontanous Breathing and Patient connected to face mask oxygen  Post-op Assessment: Report given to RN, Post -op Vital signs reviewed and stable and Patient moving all extremities X 4  Post vital signs: Reviewed and stable  Last Vitals:  Vitals Value Taken Time  BP 139/68 08/16/19 0845  Temp    Pulse 64 08/16/19 0851  Resp 13 08/16/19 0851  SpO2 98 % 08/16/19 0851  Vitals shown include unvalidated device data.  Last Pain:  Vitals:   08/16/19 0848  TempSrc:   PainSc: (P) Asleep      Patients Stated Pain Goal: 1 ([id number])  Complications: No apparent anesthesia complications

## 2019-08-16 NOTE — Interval H&P Note (Signed)
History and Physical Interval Note:  08/16/2019 7:06 AM  Debra Keller  has presented today for surgery, with the diagnosis of left breast cancer.  The various methods of treatment have been discussed with the patient and family. After consideration of risks, benefits and other options for treatment, the patient has consented to  Procedure(s) with comments: BREAST LUMPECTOMY WITH RADIOACTIVE SEED AND SENTINEL LYMPH NODE BIOPSY (Left) - GEN PEC as a surgical intervention.  The patient's history has been reviewed, patient examined, no change in status, stable for surgery.  I have reviewed the patient's chart and labs.  Questions were answered to the patient's satisfaction.     [employer]

## 2019-08-16 NOTE — Progress Notes (Signed)
Emotional support during nuclear medicine injections.

## 2019-08-16 NOTE — Anesthesia Procedure Notes (Signed)
Procedure Name: LMA Insertion Date/Time: 08/16/2019 7:46 AM Performed by: Niel Hummer, CRNA Pre-anesthesia Checklist: Patient identified, Emergency Drugs available, Suction available and Patient being monitored Patient Re-evaluated:Patient Re-evaluated prior to induction Oxygen Delivery Method: Circle system utilized Preoxygenation: Pre-oxygenation with 100% oxygen Induction Type: IV induction Ventilation: Mask ventilation without difficulty LMA: LMA inserted LMA Size: 4.0 Number of attempts: 1 Dental Injury: Teeth and Oropharynx as per pre-operative assessment

## 2019-08-16 NOTE — Progress Notes (Signed)
Assisted Dr. Marcie Bal with left, ultrasound guided, pectoralis block. Side rails up, monitors on throughout procedure. See vital signs in flow sheet. Tolerated Procedure well.

## 2019-08-16 NOTE — Anesthesia Preprocedure Evaluation (Addendum)
Anesthesia Evaluation  Patient identified by MRN, date of birth, ID band Patient awake    Reviewed: Allergy & Precautions, H&P , NPO status , Patient's Chart, lab work & pertinent test results  Airway Mallampati: II   Neck ROM: full    Dental   Pulmonary shortness of breath, COPD, Current Smoker,    breath sounds clear to auscultation       Cardiovascular hypertension,  Rhythm:regular Rate:Normal     Neuro/Psych CVA    GI/Hepatic   Endo/Other    Renal/GU      Musculoskeletal   Abdominal   Peds  Hematology   Anesthesia Other Findings   Reproductive/Obstetrics Breast CA                            Anesthesia Physical Anesthesia Plan  ASA: III  Anesthesia Plan: General   Post-op Pain Management:  Regional for Post-op pain   Induction: Intravenous  PONV Risk Score and Plan: 2 and Ondansetron, Dexamethasone and Treatment may vary due to age or medical condition  Airway Management Planned: Oral ETT  Additional Equipment:   Intra-op Plan:   Post-operative Plan: Extubation in OR  Informed Consent: I have reviewed the patients History and Physical, chart, labs and discussed the procedure including the risks, benefits and alternatives for the proposed anesthesia with the patient or authorized representative who has indicated his/her understanding and acceptance.       Plan Discussed with: CRNA, Anesthesiologist and Surgeon  Anesthesia Plan Comments:         Anesthesia Quick Evaluation

## 2019-08-16 NOTE — Discharge Instructions (Signed)
[facility] °Office Phone Number [phone] ° °BREAST BIOPSY/ PARTIAL MASTECTOMY: POST OP INSTRUCTIONS ° °Always review your discharge instruction sheet given to you by the facility where your surgery was performed. ° °IF YOU HAVE DISABILITY OR FAMILY LEAVE FORMS, YOU MUST BRING THEM TO THE OFFICE FOR PROCESSING.  DO NOT GIVE THEM TO YOUR DOCTOR. ° °1. A prescription for pain medication may be given to you upon discharge.  Take your pain medication as prescribed, if needed.  If narcotic pain medicine is not needed, then you may take acetaminophen (Tylenol) or ibuprofen (Advil) as needed. °2. Take your usually prescribed medications unless otherwise directed °3. If you need a refill on your pain medication, please contact your pharmacy.  They will contact our office to request authorization.  Prescriptions will not be filled after 5pm or on week-ends. °4. You should eat very light the first 24 hours after surgery, such as soup, crackers, pudding, etc.  Resume your normal diet the day after surgery. °5. Most patients will experience some swelling and bruising in the breast.  Ice packs and a good support bra will help.  Swelling and bruising can take several days to resolve.  °6. It is common to experience some constipation if taking pain medication after surgery.  Increasing fluid intake and taking a stool softener will usually help or prevent this problem from occurring.  A mild laxative (Milk of Magnesia or Miralax) should be taken according to package directions if there are no bowel movements after 48 hours. °7. Unless discharge instructions indicate otherwise, you may remove your bandages 24-48 hours after surgery, and you may shower at that time.  You may have steri-strips (small skin tapes) in place directly over the incision.  These strips should be left on the skin for 7-10 days.  If your surgeon used skin glue on the incision, you may shower in 24 hours.  The glue will flake off over the  next 2-3 weeks.  Any sutures or staples will be removed at the office during your follow-up visit. °8. ACTIVITIES:  You may resume regular daily activities (gradually increasing) beginning the next day.  Wearing a good support bra or sports bra minimizes pain and swelling.  You may have sexual intercourse when it is comfortable. °a. You may drive when you no longer are taking prescription pain medication, you can comfortably wear a seatbelt, and you can safely maneuver your car and apply brakes. °b. RETURN TO WORK:  ______________________________________________________________________________________ °9. You should see your doctor in the office for a follow-up appointment approximately two weeks after your surgery.  Your doctor’s nurse will typically make your follow-up appointment when she calls you with your pathology report.  Expect your pathology report 2-3 business days after your surgery.  You may call to check if you do not hear from us after three days. °10. OTHER INSTRUCTIONS: _______________________________________________________________________________________________ _____________________________________________________________________________________________________________________________________ °_____________________________________________________________________________________________________________________________________ °_____________________________________________________________________________________________________________________________________ ° °WHEN TO CALL YOUR DOCTOR: °1. Fever over 101.0 °2. Nausea and/or vomiting. °3. Extreme swelling or bruising. °4. Continued bleeding from incision. °5. Increased pain, redness, or drainage from the incision. ° °The clinic staff is available to answer your questions during regular business hours.  Please don’t hesitate to call and ask to speak to one of the nurses for clinical concerns.  If you have a medical emergency, go to the nearest  emergency room or call 911.  A surgeon from [facility] is always on call at the hospital. ° °For further questions, please visit [email or web address]  ° ° ° ° °  Post Anesthesia Home Care Instructions ° °Activity: °Get plenty of rest for the remainder of the day. A responsible individual must stay with you for 24 hours following the procedure.  °For the next 24 hours, DO NOT: °-Drive a car °-Operate machinery °-Drink alcoholic beverages °-Take any medication unless instructed by your physician °-Make any legal decisions or sign important papers. ° °Meals: °Start with liquid foods such as gelatin or soup. Progress to regular foods as tolerated. Avoid greasy, spicy, heavy foods. If nausea and/or vomiting occur, drink only clear liquids until the nausea and/or vomiting subsides. Call your physician if vomiting continues. ° °Special Instructions/Symptoms: °Your throat may feel dry or sore from the anesthesia or the breathing tube placed in your throat during surgery. If this causes discomfort, gargle with warm salt water. The discomfort should disappear within 24 hours. ° °If you had a scopolamine patch placed behind your ear for the management of post- operative nausea and/or vomiting: ° °1. The medication in the patch is effective for 72 hours, after which it should be removed.  Wrap patch in a tissue and discard in the trash. Wash hands thoroughly with soap and water. °2. You may remove the patch earlier than 72 hours if you experience unpleasant side effects which may include dry mouth, dizziness or visual disturbances. °3. Avoid touching the patch. Wash your hands with soap and water after contact with the patch. °  ° °

## 2019-08-17 ENCOUNTER — Encounter: Payer: Self-pay | Admitting: *Deleted

## 2019-08-17 NOTE — Anesthesia Postprocedure Evaluation (Signed)
Anesthesia Post Note  Patient: Debra Keller  Procedure(s) Performed: BREAST LUMPECTOMY WITH RADIOACTIVE SEED AND SENTINEL LYMPH NODE BIOPSY (Left Breast)     Patient location during evaluation: PACU Anesthesia Type: General Level of consciousness: awake and alert Pain management: pain level controlled Vital Signs Assessment: post-procedure vital signs reviewed and stable Respiratory status: spontaneous breathing, nonlabored ventilation, respiratory function stable and patient connected to nasal cannula oxygen Cardiovascular status: blood pressure returned to baseline and stable Postop Assessment: no apparent nausea or vomiting Anesthetic complications: no    Last Vitals:  Vitals:   08/16/19 0946 08/16/19 1015  BP:  132/67  Pulse: 66 63  Resp: 19 18  Temp:  (!) 36.4 C  SpO2: 93% 97%    Last Pain:  Vitals:   08/16/19 1015  TempSrc: Oral  PainSc: 0-No pain                 Johnmark Geiger S

## 2019-08-21 ENCOUNTER — Other Ambulatory Visit: Payer: Self-pay | Admitting: *Deleted

## 2019-08-21 ENCOUNTER — Encounter: Payer: Self-pay | Admitting: *Deleted

## 2019-08-21 DIAGNOSIS — C50212 Malignant neoplasm of upper-inner quadrant of left female breast: Secondary | ICD-10-CM

## 2019-08-24 LAB — SURGICAL PATHOLOGY

## 2019-08-27 ENCOUNTER — Encounter: Payer: Self-pay | Admitting: *Deleted

## 2019-08-30 ENCOUNTER — Encounter: Payer: Self-pay | Admitting: *Deleted

## 2019-08-30 ENCOUNTER — Other Ambulatory Visit: Payer: Self-pay | Admitting: Oncology

## 2019-09-05 ENCOUNTER — Encounter: Payer: Self-pay | Admitting: *Deleted

## 2019-09-18 NOTE — Progress Notes (Signed)
[place]  Telephone:([phone]) [phone] Fax:(336) [phone]     ID: Debra Keller DOB: 1940/05/15  MR#: [id number]  MAU#:[id number]  Patient Care Team: Sueanne Margarita, DO as PCP - General (Internal Medicine) Lucas Mallow, MD as Consulting Physician (Urology) Mauro Kaufmann, RN as Oncology Nurse Navigator Rockwell Germany, RN as Oncology Nurse Navigator Erroll Luna, MD as Consulting Physician (General Surgery) Anallely Rosell, Virgie Dad, MD as Consulting Physician (Oncology) Eppie Gibson, MD as Attending Physician (Radiation Oncology) Chauncey Cruel, MD OTHER MD:  CHIEF COMPLAINT: Estrogen receptor positive breast cancer  CURRENT TREATMENT: Anastrozole   INTERVAL HISTORY: Debra "PAT" returns today for follow up of her new estrogen receptor positive breast cancer. She was evaluated in the multidisciplinary breast cancer clinic on 07/25/2019.  She is accompanied by her husband.  She opted to proceed with left lumpectomy on 08/16/2019 under Dr. Brantley Stage. 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  Both biopsied lymph nodes were negative for carcinoma (0/2).  Her case was rediscussed at the 08/29/2019 multidisciplinary breast cancer conference and at that point it was felt that no adjuvant radiation was indicated so long as the patient took antiestrogens for 5 years.   REVIEW OF SYSTEMS: Debra Keller did well with her surgery.  She has a little bit of numbness and discomfort in the medial aspect of her left upper arm, not in the axilla.  There has been no swelling or redness.  She has a good range of motion.  A detailed review of systems today was otherwise stable   HISTORY OF CURRENT ILLNESS: From the most recent intake note:  "Debra Keller" was last seen in the breast cancer clinic in 03/2012. 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  She had routine screening mammography on 06/20/2019 showing a possible abnormality in the left breast. She underwent left diagnostic mammography with tomography and left breast ultrasonography at [facility] on 07/09/2019 showing: breast density category B; 4 mm spiculated mass in left breast at 11:30; three abnormal lymph nodes, possibly related to recent Covid-19 vaccine.  Accordingly on 07/18/2019 she proceeded to biopsy of the left breast area in question. The pathology from this procedure ([id number]) showed: invasive mammary carcinoma, grade 2, e-cadherin positive. Prognostic indicators significant for: estrogen receptor, 100% positive and progesterone receptor, 100% positive, both with strong staining intensity. Proliferation marker Ki67 at 1%. HER2 negative by immunohistochemistry (1+).  The patient's subsequent history is as detailed below.   PAST MEDICAL HISTORY: Past Medical History:  Diagnosis Date  . AAA (abdominal aortic aneurysm) without rupture [facility])    vascular consult w/ dr Trula Slade 12-19-2017,  measures 2.8cm  . Balance problem    uses cane  . Bladder tumor   . Breast cancer, stage 1, estrogen receptor positive, right [facility]) oncologist-  dr Jana Hakim   dx 07/ 2008----  Stage IA (T1b,N0)  Grade 1,  invasive lobular carcinoma--- s/p  right breast lumpectomy w/ sln dissections 10-13-2006  and completed radiation 10/ 2008,  completed tamoxifen 2016  . Centrilobular emphysema ([place])    per CT 12-07-2017  . COPD (chronic obstructive pulmonary disease) ([place])   . Dyspnea  with exertion  . Frequency of urination   . Full dentures   . Hematuria   . History of external beam radiation therapy    completed 10/  2008  right breast cancer  . History of TIAs    12-29-2017 per pt never had symptoms, but pcp found per MRI had 3 TIAs,  residual balance issues  . Hyperlipidemia   . Hypertension   . Stroke [facility]) few yrs agi   left sided weakness and speech   . Wears glasses     PAST SURGICAL HISTORY: Past Surgical History:  Procedure Laterality Date  . ABDOMINAL HYSTERECTOMY  1980s   W/ UNILATERAL SALPINGOOPHORECTOMY  . APPENDECTOMY  many yrs ago  . BREAST LUMPECTOMY WITH NEEDLE LOCALIZATION AND AXILLARY LYMPH NODE DISSECTION Right 10-13-2006   dr cornett  _0   . BREAST LUMPECTOMY WITH RADIOACTIVE SEED AND SENTINEL LYMPH NODE BIOPSY Left 08/16/2019   Procedure: BREAST LUMPECTOMY WITH RADIOACTIVE SEED AND SENTINEL LYMPH NODE BIOPSY;  Surgeon: Erroll Luna, MD;  Location: [facility];  Service: General;  Laterality: Left;  GEN PEC  . SHOULDER OPEN ROTATOR CUFF REPAIR Left early 2000s  . TONSILLECTOMY  child  . TRANSURETHRAL RESECTION OF BLADDER TUMOR N/A 02/03/2018   Procedure: RESTAGE TRANSURETHRAL RESECTION OF BLADDER TUMOR (TURBT);  Surgeon: Lucas Mallow, MD;  Location: [facility];  Service: Urology;  Laterality: N/A;  . TRANSURETHRAL RESECTION OF BLADDER TUMOR WITH MITOMYCIN-C N/A 01/02/2018   Procedure: TRANSURETHRAL RESECTION OF BLADDER TUMOR WITH GEMCITABINE;  Surgeon: Lucas Mallow, MD;  Location: [facility];  Service: Urology;  Laterality: N/A;    FAMILY HISTORY: No family history on file.  Debra Keller has little information on her father. Her mother died at age 87. Debra Keller has two brothers. There is no cancer in her family to her knowledge.   GYNECOLOGIC HISTORY:  No LMP recorded. Patient has had a hysterectomy. Menarche: 79 years old Age at first live birth: 79 years old [place] P 1 LMP 1983 Contraceptive: never used HRT: never used  Hysterectomy? Yes, 1983 BSO? One removed   SOCIAL HISTORY: (updated 07/2019)  Debra Keller "PAT" is currently  retired from working as a Network engineer. Husband is retired and now Insurance claims handler as a hobby. Daughter Lattie Haw, age 44, lives here in [place] with her 5 children. 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    ADVANCED DIRECTIVES: In the absence of any documentation to the contrary, the patient's spouse is their HCPOA.    HEALTH MAINTENANCE: Social History   Tobacco Use  . Smoking status: Current Every Day Smoker    Packs/day: 0.25    Years: 60.00    Pack years: 15.00    Types: Cigarettes  . Smokeless tobacco: Never Used  . Tobacco comment: 12-29-2017 per pt 4-5 cig per day  Vaping Use  . Vaping Use: Never used  Substance Use Topics  . Alcohol use: Yes    Comment: very rare  . Drug use: Never     Colonoscopy: date unknown  PAP: date unknown, s/p hysterectomy  Bone density: never done   Allergies  Allergen Reactions  . Penicillins Nausea Only  . Morphine And Related Nausea Only    Current Outpatient Medications  Medication Sig Dispense Refill  . anastrozole (ARIMIDEX) 1 MG tablet Take 1 tablet (1 mg total) by mouth daily. 90 tablet 4  . aspirin EC 81 MG tablet Take 81 mg by mouth daily.    . Fluticasone-Salmeterol (ADVAIR) 250-50 MCG/DOSE AEPB Inhale  2 puffs into the lungs once.     . hydrochlorothiazide (HYDRODIURIL) 25 MG tablet Take 25 mg by mouth every morning.     . metoprolol succinate (TOPROL-XL) 100 MG 24 hr tablet Take 100 mg by mouth every morning.      No current facility-administered medications for this visit.    OBJECTIVE: White woman in no acute distress  Vitals:   09/19/19 1140  BP: 130/69  Pulse: 69  Resp: 18  Temp: 98 F (36.7 C)  SpO2: 97%     Body mass index is 24.37 kg/m.   Wt Readings from Last 3 Encounters:  09/19/19 124 lb 12.8 oz (56.6 kg)  08/16/19 130 lb 1.1 oz (59 kg)  07/25/19 127 lb (57.6 kg)      ECOG FS:2 - Symptomatic, <50% confined to bed  Sclerae unicteric, EOMs intact Wearing a mask No cervical or supraclavicular adenopathy Lungs no rales or  rhonchi Heart regular rate and rhythm Abd soft, nontender, positive bowel sounds MSK no focal spinal tenderness, no upper extremity lymphedema Neuro: nonfocal, well oriented, appropriate affect Breasts: The right breast is status post remote lumpectomy and radiation with no evidence of disease recurrence.  Left breast is status post recent lumpectomy.  The incision is healing nicely, with no dehiscence or erythema.  Both axillae are benign.   LAB RESULTS:  CMP     Component Value Date/Time   NA 141 08/14/2019 1156   NA 142 02/28/2012 1352   K 3.6 08/14/2019 1156   K 3.7 02/28/2012 1352   CL 104 08/14/2019 1156   CL 98 02/28/2012 1352   CO2 25 08/14/2019 1156   CO2 33 (H) 02/28/2012 1352   GLUCOSE 96 08/14/2019 1156   GLUCOSE 101 (H) 02/28/2012 1352   BUN 13 08/14/2019 1156   BUN 16.0 02/28/2012 1352   CREATININE 0.73 08/14/2019 1156   CREATININE 0.92 07/25/2019 1205   CREATININE 0.8 02/28/2012 1352   CALCIUM 9.0 08/14/2019 1156   CALCIUM 9.8 02/28/2012 1352   PROT 6.4 (L) 08/14/2019 1156   PROT 6.8 02/28/2012 1352   ALBUMIN 3.5 08/14/2019 1156   ALBUMIN 3.5 02/28/2012 1352   AST 15 08/14/2019 1156   AST 16 07/25/2019 1205   AST 14 02/28/2012 1352   ALT 13 08/14/2019 1156   ALT 12 07/25/2019 1205   ALT 14 02/28/2012 1352   ALKPHOS 69 08/14/2019 1156   ALKPHOS 75 02/28/2012 1352   BILITOT 0.8 08/14/2019 1156   BILITOT 0.5 07/25/2019 1205   BILITOT 0.34 02/28/2012 1352   GFRNONAA >60 08/14/2019 1156   GFRNONAA 60 (L) 07/25/2019 1205   GFRAA >60 08/14/2019 1156   GFRAA >60 07/25/2019 1205    No results found for: TOTALPROTELP, ALBUMINELP, A1GS, A2GS, BETS, BETA2SER, GAMS, MSPIKE, SPEI  Lab Results  Component Value Date   WBC 10.1 08/14/2019   NEUTROABS 6.4 08/14/2019   HGB 14.8 08/14/2019   HCT 44.6 08/14/2019   MCV 90.3 08/14/2019   PLT 276 08/14/2019    Lab Results  Component Value Date   LABCA2 14 02/28/2012    No components found for:  IRCVEL381  No results for input(s): INR in the last 168 hours.  Lab Results  Component Value Date   LABCA2 14 02/28/2012    No results found for: OFB510  No results found for: CHE527  No results found for: POE423  No results found for: CA2729  No components found for: HGQUANT  No results found for: CEA1 / No  results found for: CEA1   No results found for: AFPTUMOR  No results found for: CHROMOGRNA  No results found for: KPAFRELGTCHN, LAMBDASER, KAPLAMBRATIO (kappa/lambda light chains)  No results found for: HGBA, HGBA2QUANT, HGBFQUANT, HGBSQUAN (Hemoglobinopathy evaluation)   Lab Results  Component Value Date   LDH 134 01/09/2007    No results found for: IRON, TIBC, IRONPCTSAT (Iron and TIBC)  No results found for: FERRITIN  Urinalysis No results found for: COLORURINE, APPEARANCEUR, LABSPEC, PHURINE, GLUCOSEU, HGBUR, BILIRUBINUR, KETONESUR, PROTEINUR, UROBILINOGEN, NITRITE, LEUKOCYTESUR   STUDIES: No results found.   ELIGIBLE FOR AVAILABLE RESEARCH PROTOCOL: AET  ASSESSMENT: 79 y.o. 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  (2) status post left breast upper inner quadrant biopsy 07/18/2019 for a clinical T1a NX, stage IA invasive ductal carcinoma, grade 2, estrogen and progesterone receptor positive, HER-2 not amplified, with an MIB-1 of 1%  (3) status post left lumpectomy and sentinel lymph node dissection 08/16/2019 for a pT1C pN0, stage  IA invasive ductal carcinoma, grade 2, with negative margins.  (4) adjuvant radiation not indicated (patient plans to take antiestrogens for 5 years)  (5) anastrozole started 09/19/2019  (a) bone density 09/30/2015 showed a T  score of -2.2   PLAN: Debra Keller did remarkably well with her breast cancer.  She can safely avoid radiation and can directly proceed to antiestrogens.  We discussed the difference between tamoxifen and anastrozole in detail. She understands that anastrozole and the aromatase inhibitors in general work by blocking estrogen production. Accordingly vaginal dryness, decrease in bone density, and of course hot flashes can result. The aromatase inhibitors can also negatively affect the cholesterol profile, although that is a minor effect. One out of 5 women on aromatase inhibitors we will feel "old and achy". This arthralgia/myalgia syndrome, which resembles fibromyalgia clinically, does resolve with stopping the medications. Accordingly this is not a reason to not try an aromatase inhibitor but it is a frequent reason to stop it (in other words 20% of women will not be able to tolerate these medications).  Tamoxifen on the other hand does not block estrogen production. It does not "take away a woman's estrogen". It blocks the estrogen receptor in breast cells. Like anastrozole, it can also cause hot flashes. As opposed to anastrozole, tamoxifen has many estrogen-like effects. It is technically an estrogen receptor modulator. This means that in some tissues tamoxifen works like estrogen-- for example it helps strengthen the bones. It tends to improve the cholesterol profile. It can cause thickening of the endometrial lining, and even endometrial polyps or rarely cancer of the uterus.(The risk of uterine cancer due to tamoxifen is one additional cancer per thousand women year). It can cause vaginal wetness or stickiness. It can cause blood clots through this estrogen-like effect--the risk of blood clots with tamoxifen is exactly the same as with birth control pills or hormone replacement.  Neither of these agents causes mood changes or weight gain, despite the popular belief that they can have these side effects. We  have data from studies comparing either of these drugs with placebo, and in those cases the control group had the same amount of weight gain and depression as the group that took the drug.  In her case I think  anastrozole is a better choice since there are no clotting concerns.  She can start today.  I am going to see her mid September to assess tolerance and she will have a repeat DEXA scan before that visit.  The last scan in 2017 showed near osteoporosis.  Very likely we will need to start a pharmacologic agent to help her bones during the anastrozole at the next visit.  Total encounter time 30 minutes.Sarajane Jews C. Jinnie Onley, MD 09/19/2019 1:43 PM Medical Oncology and Hematology [facility] [place], [place] [address] Tel. [phone]    Fax. [phone]   This document serves as a record of services personally performed by Lurline Del, MD. It was created on his behalf by Wilburn Mylar, a trained medical scribe. The creation of this record is based on the scribe's personal observations and the provider's statements to them.   I, Lurline Del MD, have reviewed the above documentation for accuracy and completeness, and I agree with the above.   *Total Encounter Time as defined by the Centers for Medicare and Medicaid Services includes, in addition to the face-to-face time of a patient visit (documented in the note above) non-face-to-face time: obtaining and reviewing outside history, ordering and reviewing medications, tests or procedures, care coordination (communications with other health care professionals or caregivers) and documentation in the medical record.

## 2019-09-19 ENCOUNTER — Other Ambulatory Visit: Payer: Self-pay

## 2019-09-19 ENCOUNTER — Inpatient Hospital Stay: Payer: Medicare Other | Attending: Oncology | Admitting: Oncology

## 2019-09-19 ENCOUNTER — Encounter: Payer: Self-pay | Admitting: *Deleted

## 2019-09-19 VITALS — BP 130/69 | HR 69 | Temp 98.0°F | Resp 18 | Ht 60.0 in | Wt 124.8 lb

## 2019-09-19 DIAGNOSIS — C50212 Malignant neoplasm of upper-inner quadrant of left female breast: Secondary | ICD-10-CM | POA: Insufficient documentation

## 2019-09-19 DIAGNOSIS — Z170 Estrogen receptor positive status [ER+]: Secondary | ICD-10-CM | POA: Insufficient documentation

## 2019-09-19 DIAGNOSIS — Z923 Personal history of irradiation: Secondary | ICD-10-CM | POA: Insufficient documentation

## 2019-09-19 DIAGNOSIS — F17210 Nicotine dependence, cigarettes, uncomplicated: Secondary | ICD-10-CM | POA: Insufficient documentation

## 2019-09-19 DIAGNOSIS — Z853 Personal history of malignant neoplasm of breast: Secondary | ICD-10-CM | POA: Diagnosis not present

## 2019-09-19 MED ORDER — ANASTROZOLE 1 MG PO TABS
1.0000 mg | ORAL_TABLET | Freq: Every day | ORAL | 4 refills | Status: DC
Start: 2019-09-19 — End: 2020-06-30

## 2019-09-20 ENCOUNTER — Telehealth: Payer: Self-pay | Admitting: Oncology

## 2019-09-20 NOTE — Telephone Encounter (Signed)
Scheduled appt per 6/16 los. Pt confirmed appt date and time.

## 2019-10-17 ENCOUNTER — Ambulatory Visit: Payer: Medicare Other | Admitting: Oncology

## 2019-12-24 ENCOUNTER — Other Ambulatory Visit: Payer: Self-pay | Admitting: *Deleted

## 2019-12-24 DIAGNOSIS — C50212 Malignant neoplasm of upper-inner quadrant of left female breast: Secondary | ICD-10-CM

## 2019-12-24 NOTE — Progress Notes (Signed)
[place]  Telephone:(336) [phone] Fax:(336) [phone]     ID: Debra Keller DOB: [birth year]  MR#: [id number]  DJT#:[id number]  Patient Care Team: Sueanne Margarita, DO as PCP - General (Internal Medicine) Lucas Mallow, MD as Consulting Physician (Urology) Mauro Kaufmann, RN as Oncology Nurse Navigator Rockwell Germany, RN as Oncology Nurse Navigator Erroll Luna, MD as Consulting Physician (General Surgery) Devondre Guzzetta, Virgie Dad, MD as Consulting Physician (Oncology) Eppie Gibson, MD as Attending Physician (Radiation Oncology) Chauncey Cruel, MD OTHER MD:  CHIEF COMPLAINT: Estrogen receptor positive breast cancer  CURRENT TREATMENT: Anastrozole   INTERVAL HISTORY: Debra "PAT" returns today for follow up of her new estrogen receptor positive breast cancer accompanied by her husband Debra Keller.   She was started on anastrozole at her last visit on 09/19/2019.  She is tolerating this remarkably well, with no hot flashes or problems with vaginal dryness.  She has had no arthralgias or myalgias.  They obtained 3 months for $4.  She is scheduled for bone density screening tomorrow, 12/26/2019. Of note, her last scan in 09/2015 showed a T-score of -2.2.   REVIEW OF SYSTEMS: Debra Keller had the Moderna vaccine and tolerated it well.  There has been no unusual bleeding rash fever or unexplained weight loss or fatigue.  A detailed review of systems was otherwise stable.  HISTORY OF CURRENT ILLNESS: From the most recent intake note:  "Debra Keller" was last seen in the breast cancer clinic in 03/2012. 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  She had routine screening mammography on 06/20/2019  showing a possible abnormality in the left breast. She underwent left diagnostic mammography with tomography and left breast ultrasonography at [facility] on 07/09/2019 showing: breast density category B; 4 mm spiculated mass in left breast at 11:30; three abnormal lymph nodes, possibly related to recent Covid-19 vaccine.  Accordingly on 07/18/2019 she proceeded to biopsy of the left breast area in question. The pathology from this procedure ([id number]) showed: invasive mammary carcinoma, grade 2, e-cadherin positive. Prognostic indicators significant for: estrogen receptor, 100% positive and progesterone receptor, 100% positive, both with strong staining intensity. Proliferation marker Ki67 at 1%. HER2 negative by immunohistochemistry (1+).  The patient's subsequent history is as detailed below.   PAST MEDICAL HISTORY: Past Medical History:  Diagnosis Date  . AAA (abdominal aortic aneurysm) without rupture [facility])    vascular consult w/ dr Trula Slade 12-19-2017,  measures 2.8cm  . Balance problem    uses cane  . Bladder tumor   . Breast cancer, stage 1, estrogen receptor positive, right [facility]) oncologist-  dr Jana Hakim   dx 07/ 2008----  Stage IA (T1b,N0)  Grade 1,  invasive lobular carcinoma--- s/p  right breast lumpectomy w/ sln dissections 10-13-2006  and completed radiation 10/ 2008,  completed tamoxifen 2016  . Centrilobular emphysema ([place])    per CT 12-07-2017  . COPD (chronic obstructive pulmonary disease) ([place])   . Dyspnea    with exertion  . Frequency of urination   . Full dentures   . Hematuria   . History of external beam radiation therapy    completed 10/ 2008  right breast cancer  . History of TIAs    12-29-2017 per pt never had symptoms,  but pcp found per MRI had 3 TIAs,  residual balance issues  . Hyperlipidemia   . Hypertension   . Stroke [facility]) few yrs agi   left sided weakness and speech   . Wears glasses     PAST SURGICAL HISTORY: Past Surgical History:   Procedure Laterality Date  . ABDOMINAL HYSTERECTOMY  1980s   W/ UNILATERAL SALPINGOOPHORECTOMY  . APPENDECTOMY  many yrs ago  . BREAST LUMPECTOMY WITH NEEDLE LOCALIZATION AND AXILLARY LYMPH NODE DISSECTION Right 10-13-2006   dr cornett  @[facility]   . BREAST LUMPECTOMY WITH RADIOACTIVE SEED AND SENTINEL LYMPH NODE BIOPSY Left 08/16/2019   Procedure: BREAST LUMPECTOMY WITH RADIOACTIVE SEED AND SENTINEL LYMPH NODE BIOPSY;  Surgeon: Erroll Luna, MD;  Location: [facility];  Service: General;  Laterality: Left;  GEN PEC  . SHOULDER OPEN ROTATOR CUFF REPAIR Left early 2000s  . TONSILLECTOMY  child  . TRANSURETHRAL RESECTION OF BLADDER TUMOR N/A 02/03/2018   Procedure: RESTAGE TRANSURETHRAL RESECTION OF BLADDER TUMOR (TURBT);  Surgeon: Lucas Mallow, MD;  Location: [facility];  Service: Urology;  Laterality: N/A;  . TRANSURETHRAL RESECTION OF BLADDER TUMOR WITH MITOMYCIN-C N/A 01/02/2018   Procedure: TRANSURETHRAL RESECTION OF BLADDER TUMOR WITH GEMCITABINE;  Surgeon: Lucas Mallow, MD;  Location: [facility];  Service: Urology;  Laterality: N/A;    FAMILY HISTORY: No family history on file.  Debra Keller has little information on her father. Her mother died at age 58. Debra Keller has two brothers. There is no cancer in her family to her knowledge.   GYNECOLOGIC HISTORY:  No LMP recorded. Patient has had a hysterectomy. Menarche: 79 years old Age at first live birth: 79 years old [place] P 1 LMP 1983 Contraceptive: never used HRT: never used  Hysterectomy? Yes, 1983 BSO? One removed   SOCIAL HISTORY: (updated 07/2019)  Mardene Celeste "PAT" is currently retired from working as a Network engineer. Husband is retired and now Insurance claims handler as a hobby. Daughter Debra Keller, age 59, lives here in [place] with her 5 children. 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    ADVANCED DIRECTIVES: In the absence of any documentation to the contrary, the patient's spouse is their HCPOA.    HEALTH  MAINTENANCE: Social History   Tobacco Use  . Smoking status: Current Every Day Smoker    Packs/day: 0.25    Years: 60.00    Pack years: 15.00    Types: Cigarettes  . Smokeless tobacco: Never Used  . Tobacco comment: 12-29-2017 per pt 4-5 cig per day  Vaping Use  . Vaping Use: Never used  Substance Use Topics  . Alcohol use: Yes    Comment: very rare  . Drug use: Never     Colonoscopy: date unknown  PAP: date unknown, s/p hysterectomy  Bone density: 09/2015, -2.2   Allergies  Allergen Reactions  . Penicillins Nausea Only  . Morphine And Related Nausea Only    Current Outpatient Medications  Medication Sig Dispense Refill  . anastrozole (ARIMIDEX) 1 MG tablet Take 1 tablet (1 mg total) by mouth daily. 90 tablet 4  . aspirin EC 81 MG tablet Take 81 mg by mouth daily.    . Fluticasone-Salmeterol (ADVAIR) 250-50 MCG/DOSE AEPB Inhale 2 puffs into the lungs once.     . hydrochlorothiazide (HYDRODIURIL) 25 MG tablet Take 25 mg by mouth every morning.     . metoprolol succinate (TOPROL-XL) 100 MG 24 hr tablet Take 100 mg by mouth every morning.      No  current facility-administered medications for this visit.    OBJECTIVE: White woman who appears stated age  24:   12/25/19 1315  BP: 129/70  Pulse: 65  Resp: 18  Temp: (!) 97.5 F (36.4 C)  SpO2: 97%     Body mass index is 24.67 kg/m.   Wt Readings from Last 3 Encounters:  12/25/19 126 lb 4.8 oz (57.3 kg)  09/19/19 124 lb 12.8 oz (56.6 kg)  08/16/19 130 lb 1.1 oz (59 kg)      ECOG FS:2 - Symptomatic, <50% confined to bed  Sclerae unicteric, EOMs intact Wearing a mask No cervical or supraclavicular adenopathy Lungs no rales or rhonchi Heart regular rate and rhythm Abd soft, nontender, positive bowel sounds MSK no focal spinal tenderness, no upper extremity lymphedema Neuro: speech affected by stroke but she answers monosyllabic fully and appropriately; high fall risk Breasts: The right breast is status post  remote lumpectomy followed by radiation.  There is no evidence of disease recurrence.  This breast is smaller than the left.  The left breast is status post recent lumpectomy.  The incision is healing nicely without dehiscence or erythema.  Both axillae are benign.   LAB RESULTS:  CMP     Component Value Date/Time   NA 140 12/25/2019 1252   NA 142 02/28/2012 1352   K 4.2 12/25/2019 1252   K 3.7 02/28/2012 1352   CL 97 (L) 12/25/2019 1252   CL 98 02/28/2012 1352   CO2 35 (H) 12/25/2019 1252   CO2 33 (H) 02/28/2012 1352   GLUCOSE 104 (H) 12/25/2019 1252   GLUCOSE 101 (H) 02/28/2012 1352   BUN 18 12/25/2019 1252   BUN 16.0 02/28/2012 1352   CREATININE 0.89 12/25/2019 1252   CREATININE 0.8 02/28/2012 1352   CALCIUM 10.0 12/25/2019 1252   CALCIUM 9.8 02/28/2012 1352   PROT 7.2 12/25/2019 1252   PROT 6.8 02/28/2012 1352   ALBUMIN 3.7 12/25/2019 1252   ALBUMIN 3.5 02/28/2012 1352   AST 19 12/25/2019 1252   AST 14 02/28/2012 1352   ALT 20 12/25/2019 1252   ALT 14 02/28/2012 1352   ALKPHOS 99 12/25/2019 1252   ALKPHOS 75 02/28/2012 1352   BILITOT 0.6 12/25/2019 1252   BILITOT 0.34 02/28/2012 1352   GFRNONAA >60 12/25/2019 1252   GFRAA >60 12/25/2019 1252    No results found for: TOTALPROTELP, ALBUMINELP, A1GS, A2GS, BETS, BETA2SER, GAMS, MSPIKE, SPEI  Lab Results  Component Value Date   WBC 10.8 (H) 12/25/2019   NEUTROABS 6.7 12/25/2019   HGB 15.2 (H) 12/25/2019   HCT 46.3 (H) 12/25/2019   MCV 91.9 12/25/2019   PLT 277 12/25/2019    Lab Results  Component Value Date   LABCA2 14 02/28/2012    No components found for: [id number]  No results for input(s): INR in the last 168 hours.  Lab Results  Component Value Date   LABCA2 14 02/28/2012    No results found for: WFU932  No results found for: TFT732  No results found for: KGU542  No results found for: CA2729  No components found for: HGQUANT  No results found for: CEA1 / No results found for: CEA1   No  results found for: AFPTUMOR  No results found for: CHROMOGRNA  No results found for: KPAFRELGTCHN, LAMBDASER, KAPLAMBRATIO (kappa/lambda light chains)  No results found for: HGBA, HGBA2QUANT, HGBFQUANT, HGBSQUAN (Hemoglobinopathy evaluation)   Lab Results  Component Value Date   LDH 134 01/09/2007    No results found  for: IRON, TIBC, IRONPCTSAT (Iron and TIBC)  No results found for: FERRITIN  Urinalysis No results found for: COLORURINE, APPEARANCEUR, LABSPEC, PHURINE, GLUCOSEU, HGBUR, BILIRUBINUR, KETONESUR, PROTEINUR, UROBILINOGEN, NITRITE, LEUKOCYTESUR   STUDIES: No results found.   ELIGIBLE FOR AVAILABLE RESEARCH PROTOCOL: AET  ASSESSMENT: 79 y.o. 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  (2) status post left breast upper inner quadrant biopsy 07/18/2019 for a clinical T1a NX, stage IA invasive ductal carcinoma, grade 2, estrogen and progesterone receptor positive, HER-2 not amplified, with an MIB-1 of 1%  (3) status post left lumpectomy and sentinel lymph node dissection 08/16/2019 for a pT1C pN0, stage  IA invasive ductal carcinoma, grade 2, with negative margins.  (4) adjuvant radiation not indicated (patient plans to take antiestrogens for 5 years)  (5) anastrozole started 09/19/2019  (a) bone density 09/30/2015 showed a T score of -2.2   PLAN: Debra Keller is doing remarkably well on the anastrozole and the plan will be to continue that a total of 5 years.  She had significant osteopenia at the last bone density and she has a repeat tomorrow.  If she is frankly osteoporotic we can consider alendronate or similar.  She will have her next mammogram in March.  She will return to  see me shortly after that test and assuming all goes well yearly thereafter  She knows to call for any other issue that may develop before the next visit.  Total encounter time 25 minutes.Sarajane Jews C. Toniya Rozar, MD 12/25/2019 1:33 PM Medical Oncology and Hematology [facility] [place], [place] [address] Tel. [phone]    Fax. [phone]   This document serves as a record of services personally performed by Lurline Del, MD. It was created on his behalf by Wilburn Mylar, a trained medical scribe. The creation of this record is based on the scribe's personal observations and the provider's statements to them.   I, Lurline Del MD, have reviewed the above documentation for accuracy and completeness, and I agree with the above.   *Total Encounter Time as defined by the Centers for Medicare and Medicaid Services includes, in addition to the face-to-face time of a patient visit (documented in the note above) non-face-to-face time: obtaining and reviewing outside history, ordering and reviewing medications, tests or procedures, care coordination (communications with other health care professionals or caregivers) and documentation in the medical record.

## 2019-12-25 ENCOUNTER — Inpatient Hospital Stay: Payer: Medicare Other | Attending: Oncology | Admitting: Oncology

## 2019-12-25 ENCOUNTER — Inpatient Hospital Stay: Payer: Medicare Other

## 2019-12-25 ENCOUNTER — Other Ambulatory Visit: Payer: Self-pay

## 2019-12-25 ENCOUNTER — Telehealth: Payer: Self-pay | Admitting: Oncology

## 2019-12-25 VITALS — BP 129/70 | HR 65 | Temp 97.5°F | Resp 18 | Ht 60.0 in | Wt 126.3 lb

## 2019-12-25 DIAGNOSIS — Z923 Personal history of irradiation: Secondary | ICD-10-CM | POA: Insufficient documentation

## 2019-12-25 DIAGNOSIS — C50212 Malignant neoplasm of upper-inner quadrant of left female breast: Secondary | ICD-10-CM | POA: Diagnosis not present

## 2019-12-25 DIAGNOSIS — Z170 Estrogen receptor positive status [ER+]: Secondary | ICD-10-CM | POA: Diagnosis not present

## 2019-12-25 DIAGNOSIS — F17210 Nicotine dependence, cigarettes, uncomplicated: Secondary | ICD-10-CM | POA: Insufficient documentation

## 2019-12-25 DIAGNOSIS — Z853 Personal history of malignant neoplasm of breast: Secondary | ICD-10-CM | POA: Diagnosis not present

## 2019-12-25 LAB — CBC WITH DIFFERENTIAL (CANCER CENTER ONLY)
Abs Immature Granulocytes: 0.03 10*3/uL (ref 0.00–0.07)
Basophils Absolute: 0.1 10*3/uL (ref 0.0–0.1)
Basophils Relative: 1 %
Eosinophils Absolute: 0.3 10*3/uL (ref 0.0–0.5)
Eosinophils Relative: 3 %
HCT: 46.3 % — ABNORMAL HIGH (ref 36.0–46.0)
Hemoglobin: 15.2 g/dL — ABNORMAL HIGH (ref 12.0–15.0)
Immature Granulocytes: 0 %
Lymphocytes Relative: 27 %
Lymphs Abs: 3 10*3/uL (ref 0.7–4.0)
MCH: 30.2 pg (ref 26.0–34.0)
MCHC: 32.8 g/dL (ref 30.0–36.0)
MCV: 91.9 fL (ref 80.0–100.0)
Monocytes Absolute: 0.8 10*3/uL (ref 0.1–1.0)
Monocytes Relative: 7 %
Neutro Abs: 6.7 10*3/uL (ref 1.7–7.7)
Neutrophils Relative %: 62 %
Platelet Count: 277 10*3/uL (ref 150–400)
RBC: 5.04 MIL/uL (ref 3.87–5.11)
RDW: 14 % (ref 11.5–15.5)
WBC Count: 10.8 10*3/uL — ABNORMAL HIGH (ref 4.0–10.5)
nRBC: 0 % (ref 0.0–0.2)

## 2019-12-25 LAB — CMP (CANCER CENTER ONLY)
ALT: 20 U/L (ref 0–44)
AST: 19 U/L (ref 15–41)
Albumin: 3.7 g/dL (ref 3.5–5.0)
Alkaline Phosphatase: 99 U/L (ref 38–126)
Anion gap: 8 (ref 5–15)
BUN: 18 mg/dL (ref 8–23)
CO2: 35 mmol/L — ABNORMAL HIGH (ref 22–32)
Calcium: 10 mg/dL (ref 8.9–10.3)
Chloride: 97 mmol/L — ABNORMAL LOW (ref 98–111)
Creatinine: 0.89 mg/dL (ref 0.44–1.00)
GFR, Est AFR Am: >60 mL/min (ref >60)
GFR, Estimated: >60 mL/min (ref >60)
Glucose, Bld: 104 mg/dL — ABNORMAL HIGH (ref 70–99)
Potassium: 4.2 mmol/L (ref 3.5–5.1)
Sodium: 140 mmol/L (ref 135–145)
Total Bilirubin: 0.6 mg/dL (ref 0.3–1.2)
Total Protein: 7.2 g/dL (ref 6.5–8.1)

## 2019-12-25 NOTE — Telephone Encounter (Signed)
Scheduled appts per 9/21 los. Gave pt a print out of appt calendar.

## 2019-12-26 ENCOUNTER — Ambulatory Visit
Admission: RE | Admit: 2019-12-26 | Discharge: 2019-12-26 | Disposition: A | Discharge status: Home / Self Care | Payer: Medicare Other | Source: Ambulatory Visit | Attending: Oncology | Admitting: Oncology

## 2019-12-26 DIAGNOSIS — Z170 Estrogen receptor positive status [ER+]: Secondary | ICD-10-CM

## 2020-06-26 ENCOUNTER — Other Ambulatory Visit: Payer: Self-pay

## 2020-06-26 DIAGNOSIS — C50212 Malignant neoplasm of upper-inner quadrant of left female breast: Secondary | ICD-10-CM

## 2020-06-26 DIAGNOSIS — Z170 Estrogen receptor positive status [ER+]: Secondary | ICD-10-CM

## 2020-06-29 NOTE — Progress Notes (Signed)
[facility]  [phone] [phone]     ID: Debra Keller DOB: December 08, 1940  MR#: [id number]  RPR#:[id number]  Patient Care Team: Sueanne Margarita, DO as PCP - General (Internal Medicine) Lucas Mallow, MD as Consulting Physician (Urology) Mauro Kaufmann, RN as Oncology Nurse Navigator Rockwell Germany, RN as Oncology Nurse Navigator Erroll Luna, MD as Consulting Physician (General Surgery) Magrinat, Virgie Dad, MD as Consulting Physician (Oncology) Eppie Gibson, MD as Attending Physician (Radiation Oncology) Chauncey Cruel, MD OTHER MD:  CHIEF COMPLAINT: Estrogen receptor positive breast cancer  CURRENT TREATMENT: Anastrozole   INTERVAL HISTORY: Debra "PAT" returns today for follow up of her new estrogen receptor positive breast cancer accompanied by her husband Debra Keller.   She started anastrozole on 09/19/2019.  The only problem she has from this is night sweats.  We discussed gabapentin but she did not want to give that a try.  Since her last visit, she underwent bone density screening on 12/26/2019 showing a T-score of -2.5, which is considered osteoporotic.  She is scheduled for annual mammography on 07/18/2020.   REVIEW OF SYSTEMS: Debra Keller somehow injured her right calf area.  She could not walk for a while but that is now much improved and she is getting around back to baseline.  She was started on vitamin D and alendronate by Dr. Francesco Sor.  She tells me she has been on it 2 or 3 weeks with no problems so far.  A detailed review of systems today was otherwise stable   COVID 19 VACCINATION STATUS: Moderna x2   HISTORY OF CURRENT ILLNESS: From the most recent intake note:  "Debra Keller" was last seen in the breast cancer clinic in 03/2012. 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  She had routine screening mammography on 06/20/2019 showing a possible abnormality in the left breast. She underwent left diagnostic mammography with tomography and left breast ultrasonography at [facility] on 07/09/2019 showing: breast density category B; 4 mm spiculated mass in left breast at 11:30; three abnormal lymph nodes, possibly related to recent Covid-19 vaccine.  Accordingly on 07/18/2019 she proceeded to biopsy of the left breast area in question. The pathology from this procedure ([id number]) showed: invasive mammary carcinoma, grade 2, e-cadherin positive. Prognostic indicators significant for: estrogen receptor, 100% positive and progesterone receptor, 100% positive, both with strong staining intensity. Proliferation marker Ki67 at 1%. HER2 negative by immunohistochemistry (1+).  The patient's subsequent history is as detailed below.   PAST MEDICAL HISTORY: Past Medical History:  Diagnosis Date  . AAA (abdominal aortic aneurysm) without rupture [facility])    vascular consult w/ dr Trula Slade 12-19-2017,  measures 2.8cm  . Balance problem    uses cane  . Bladder tumor   . Breast cancer, stage 1, estrogen receptor positive, right [facility]) oncologist-  dr Jana Hakim   dx 07/ 2008----  Stage IA (T1b,N0)  Grade 1,  invasive lobular carcinoma--- s/p  right breast lumpectomy w/ sln dissections 10-13-2006  and completed radiation 10/ 2008,  completed tamoxifen 2016  . Centrilobular emphysema ([place])    per CT 12-07-2017  . COPD (chronic obstructive pulmonary disease) ([place])   . Dyspnea    with exertion  . Frequency of urination   .  Full dentures   . Hematuria   . History of external beam radiation therapy    completed 10/ 2008  right breast cancer  . History of TIAs    12-29-2017 per pt never had symptoms, but pcp found per MRI had 3 TIAs,  residual balance issues   . Hyperlipidemia   . Hypertension   . Stroke [facility]) few yrs agi   left sided weakness and speech   . Wears glasses     PAST SURGICAL HISTORY: Past Surgical History:  Procedure Laterality Date  . ABDOMINAL HYSTERECTOMY  1980s   W/ UNILATERAL SALPINGOOPHORECTOMY  . APPENDECTOMY  many yrs ago  . BREAST LUMPECTOMY WITH NEEDLE LOCALIZATION AND AXILLARY LYMPH NODE DISSECTION Right 10-13-2006   dr cornett  @[facility]   . BREAST LUMPECTOMY WITH RADIOACTIVE SEED AND SENTINEL LYMPH NODE BIOPSY Left 08/16/2019   Procedure: BREAST LUMPECTOMY WITH RADIOACTIVE SEED AND SENTINEL LYMPH NODE BIOPSY;  Surgeon: Erroll Luna, MD;  Location: [place];  Service: General;  Laterality: Left;  GEN PEC  . SHOULDER OPEN ROTATOR CUFF REPAIR Left early 2000s  . TONSILLECTOMY  child  . TRANSURETHRAL RESECTION OF BLADDER TUMOR N/A 02/03/2018   Procedure: RESTAGE TRANSURETHRAL RESECTION OF BLADDER TUMOR (TURBT);  Surgeon: Lucas Mallow, MD;  Location[facility];  Service: Urology;  Laterality: N/A;  . TRANSURETHRAL RESECTION OF BLADDER TUMOR WITH MITOMYCIN-C N/A 01/02/2018   Procedure: TRANSURETHRAL RESECTION OF BLADDER TUMOR WITH GEMCITABINE;  Surgeon: Lucas Mallow, MD;  Location: [facility];  Service: Urology;  Laterality: N/A;    FAMILY HISTORY: No family history on file.  Debra Keller has little information on her father. Her mother died at age 40. Debra Keller has two brothers. There is no cancer in her family to her knowledge.   GYNECOLOGIC HISTORY:  No LMP recorded. Patient has had a hysterectomy. Menarche: 80 years old Age at first live birth: 80 years old [place] P 1 LMP 1983 Contraceptive: never used HRT: never used  Hysterectomy? Yes, 1983 BSO? One removed   SOCIAL HISTORY: (updated 07/2019)  Debra Celeste "PAT" retired from working as a Network engineer. Husband is retired and now Insurance claims handler as a hobby.  He will be turning 81 this year 2022 and still rides his pedal bike  about 100 miles a week.  Daughter Debra Keller, age 28, lives here in [place] with her 5 children. 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    ADVANCED DIRECTIVES: In the absence of any documentation to the contrary, the patient's spouse is their HCPOA.    HEALTH MAINTENANCE: Social History   Tobacco Use  . Smoking status: Current Every Day Smoker    Packs/day: 0.25    Years: 60.00    Pack years: 15.00    Types: Cigarettes  . Smokeless tobacco: Never Used  . Tobacco comment: 12-29-2017 per pt 4-5 cig per day  Vaping Use  . Vaping Use: Never used  Substance Use Topics  . Alcohol use: Yes    Comment: very rare  . Drug use: Never     Colonoscopy: date unknown  PAP: date unknown, s/p hysterectomy  Bone density: 09/2015, -2.2   Allergies  Allergen Reactions  . Penicillins Nausea Only  . Morphine And Related Nausea Only    Current Outpatient Medications  Medication Sig Dispense Refill  . alendronate (FOSAMAX) 70 MG tablet Take 1 tablet (70 mg total) by mouth once a week. Take with a full glass of water on an empty stomach.    Marland Kitchen  anastrozole (ARIMIDEX) 1 MG tablet Take 1 tablet (1 mg total) by mouth daily. 90 tablet 4  . aspirin EC 81 MG tablet Take 81 mg by mouth daily.    . Fluticasone-Salmeterol (ADVAIR) 250-50 MCG/DOSE AEPB Inhale 2 puffs into the lungs once.     . hydrochlorothiazide (HYDRODIURIL) 25 MG tablet Take 25 mg by mouth every morning.     . metoprolol succinate (TOPROL-XL) 100 MG 24 hr tablet Take 100 mg by mouth every morning.      No current facility-administered medications for this visit.    OBJECTIVE: White woman who appears stated age  80:   06/30/20 1315  BP: (!) 125/57  Pulse: 65  Resp: 16  Temp: (!) 97.2 F (36.2 C)  SpO2: 98%     Body mass index is 23.79 kg/m.   Wt Readings from Last 3 Encounters:  06/30/20 121 lb 12.8 oz (55.2 kg)  12/25/19 126 lb 4.8 oz (57.3 kg)  09/19/19 124 lb 12.8 oz (56.6 kg)      ECOG FS:2 - Symptomatic, <50% confined to bed  Sclerae  unicteric, EOMs intact Wearing a mask No cervical or supraclavicular adenopathy Lungs no rales or rhonchi Heart regular rate and rhythm Abd soft, nontender, positive bowel sounds MSK no focal spinal tenderness, no upper extremity lymphedema Neuro: nonfocal, well oriented, appropriate affect Breasts: The right breast is status post remote lumpectomy and radiation.  There is no evidence of disease recurrence.  Left breast is status post more recent lumpectomy not followed by radiation.  There is no evidence of disease recurrence.  Both axillae are benign.   LAB RESULTS:  CMP     Component Value Date/Time   NA 140 12/25/2019 1252   NA 142 02/28/2012 1352   K 4.2 12/25/2019 1252   K 3.7 02/28/2012 1352   CL 97 (L) 12/25/2019 1252   CL 98 02/28/2012 1352   CO2 35 (H) 12/25/2019 1252   CO2 33 (H) 02/28/2012 1352   GLUCOSE 104 (H) 12/25/2019 1252   GLUCOSE 101 (H) 02/28/2012 1352   BUN 18 12/25/2019 1252   BUN 16.0 02/28/2012 1352   CREATININE 0.89 12/25/2019 1252   CREATININE 0.8 02/28/2012 1352   CALCIUM 10.0 12/25/2019 1252   CALCIUM 9.8 02/28/2012 1352   PROT 7.2 12/25/2019 1252   PROT 6.8 02/28/2012 1352   ALBUMIN 3.7 12/25/2019 1252   ALBUMIN 3.5 02/28/2012 1352   AST 19 12/25/2019 1252   AST 14 02/28/2012 1352   ALT 20 12/25/2019 1252   ALT 14 02/28/2012 1352   ALKPHOS 99 12/25/2019 1252   ALKPHOS 75 02/28/2012 1352   BILITOT 0.6 12/25/2019 1252   BILITOT 0.34 02/28/2012 1352   GFRNONAA >60 12/25/2019 1252   GFRAA >60 12/25/2019 1252    No results found for: TOTALPROTELP, ALBUMINELP, A1GS, A2GS, BETS, BETA2SER, GAMS, MSPIKE, SPEI  Lab Results  Component Value Date   WBC 12.6 (H) 06/30/2020   NEUTROABS 8.2 (H) 06/30/2020   HGB 14.3 06/30/2020   HCT 42.6 06/30/2020   MCV 94.5 06/30/2020   PLT 290 06/30/2020    Lab Results  Component Value Date   LABCA2 14 02/28/2012    No components found for: STMHDQ222  No results for input(s): INR in the last 168  hours.  Lab Results  Component Value Date   LABCA2 14 02/28/2012    No results found for: LNL892  No results found for: JJH417  No results found for: EYC144  No results found for:  HK7425  No components found for: HGQUANT  No results found for: CEA1 / No results found for: CEA1   No results found for: AFPTUMOR  No results found for: CHROMOGRNA  No results found for: KPAFRELGTCHN, LAMBDASER, KAPLAMBRATIO (kappa/lambda light chains)  No results found for: HGBA, HGBA2QUANT, HGBFQUANT, HGBSQUAN (Hemoglobinopathy evaluation)   Lab Results  Component Value Date   LDH 134 01/09/2007    No results found for: IRON, TIBC, IRONPCTSAT (Iron and TIBC)  No results found for: FERRITIN  Urinalysis No results found for: COLORURINE, APPEARANCEUR, LABSPEC, PHURINE, GLUCOSEU, HGBUR, BILIRUBINUR, KETONESUR, PROTEINUR, UROBILINOGEN, NITRITE, LEUKOCYTESUR   STUDIES: No results found.   ELIGIBLE FOR AVAILABLE RESEARCH PROTOCOL: AET  ASSESSMENT: 80 y.o. 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  (2) status post left breast upper inner quadrant biopsy 07/18/2019 for a clinical T1a NX, stage IA invasive ductal carcinoma, grade 2, estrogen and progesterone receptor positive, HER-2 not amplified, with an MIB-1 of 1%  (3) status post left lumpectomy and sentinel lymph node dissection 08/16/2019 for a pT1C pN0, stage  IA invasive ductal carcinoma, grade 2, with negative margins.  (4) adjuvant radiation not indicated (patient plans to take antiestrogens for 5 years)  (5) anastrozole started 09/19/2019  (a) bone density 09/30/2015 showed a T score of -2.2  (b) bone density 12/26/2019 shows a T  score of -2.5  (c) alendronate started March 2022   PLAN: Debra Keller is now close to a year out from definitive surgery for her left-sided breast cancer with no evidence of disease recurrence.  She is more than 12 years out from her right-sided breast cancer.  The plan is to continue anastrozole for a total of 5 years.  We obtained a bone density which shows osteoporosis.  I started discussing bisphosphonates with her and she tells me that Dr. Nyoka Cowden started her on Fosamax, it was intolerable, and she ended up receiving an infusion (likely either pamidronate or zoledronate) just 1 time.  Then when I reviewed her medicines I realized the Drs. Doren Custard had started her on alendronate, which is of course Fosamax.  She is tolerating this just fine.  I am hopeful she will be able to continue it for a minimum of 2 years  She has mammography in April of this year and April of next year.  We will see her in May of next year  She knows to call for any other issue that may develop before then  Total encounter time 20 minutes.  Sarajane Jews C. Magrinat, MD 06/30/2020 1:33 PM [facility] [place], [place] [address] Tel. [phone]    Fax. [phone]   This document serves as a record of services personally performed by Lurline Del, MD. It was created on his behalf by Wilburn Mylar, a trained medical scribe. The creation of this record is based on the scribe's personal observations and the provider's statements to them.   I, Lurline Del MD, have reviewed the above documentation for accuracy and completeness, and I agree with the above.   *Total Encounter Time as defined by the Centers for Medicare and Medicaid Services includes, in addition to the face-to-face time of a patient visit (documented in  the note above) non-face-to-face time: obtaining and reviewing outside history, ordering and reviewing medications, tests or procedures, care  coordination (communications with other health care professionals or caregivers) and documentation in the medical record.

## 2020-06-30 ENCOUNTER — Inpatient Hospital Stay: Payer: Medicare Other | Attending: Oncology | Admitting: Oncology

## 2020-06-30 ENCOUNTER — Inpatient Hospital Stay: Payer: Medicare Other

## 2020-06-30 ENCOUNTER — Other Ambulatory Visit: Payer: Self-pay

## 2020-06-30 VITALS — BP 125/57 | HR 65 | Temp 97.2°F | Resp 16 | Ht 60.0 in | Wt 121.8 lb

## 2020-06-30 DIAGNOSIS — F17210 Nicotine dependence, cigarettes, uncomplicated: Secondary | ICD-10-CM | POA: Insufficient documentation

## 2020-06-30 DIAGNOSIS — Z170 Estrogen receptor positive status [ER+]: Secondary | ICD-10-CM | POA: Insufficient documentation

## 2020-06-30 DIAGNOSIS — Z853 Personal history of malignant neoplasm of breast: Secondary | ICD-10-CM | POA: Insufficient documentation

## 2020-06-30 DIAGNOSIS — C50212 Malignant neoplasm of upper-inner quadrant of left female breast: Secondary | ICD-10-CM | POA: Diagnosis not present

## 2020-06-30 DIAGNOSIS — M810 Age-related osteoporosis without current pathological fracture: Secondary | ICD-10-CM | POA: Diagnosis not present

## 2020-06-30 LAB — CBC WITH DIFFERENTIAL (CANCER CENTER ONLY)
Abs Immature Granulocytes: 0.04 10*3/uL (ref 0.00–0.07)
Basophils Absolute: 0.1 10*3/uL (ref 0.0–0.1)
Basophils Relative: 0 %
Eosinophils Absolute: 0.2 10*3/uL (ref 0.0–0.5)
Eosinophils Relative: 2 %
HCT: 42.6 % (ref 36.0–46.0)
Hemoglobin: 14.3 g/dL (ref 12.0–15.0)
Immature Granulocytes: 0 %
Lymphocytes Relative: 26 %
Lymphs Abs: 3.3 10*3/uL (ref 0.7–4.0)
MCH: 31.7 pg (ref 26.0–34.0)
MCHC: 33.6 g/dL (ref 30.0–36.0)
MCV: 94.5 fL (ref 80.0–100.0)
Monocytes Absolute: 0.8 10*3/uL (ref 0.1–1.0)
Monocytes Relative: 7 %
Neutro Abs: 8.2 10*3/uL — ABNORMAL HIGH (ref 1.7–7.7)
Neutrophils Relative %: 65 %
Platelet Count: 290 10*3/uL (ref 150–400)
RBC: 4.51 MIL/uL (ref 3.87–5.11)
RDW: 15 % (ref 11.5–15.5)
WBC Count: 12.6 10*3/uL — ABNORMAL HIGH (ref 4.0–10.5)
nRBC: 0 % (ref 0.0–0.2)

## 2020-06-30 LAB — CMP (CANCER CENTER ONLY)
ALT: 13 U/L (ref 0–44)
AST: 11 U/L — ABNORMAL LOW (ref 15–41)
Albumin: 3.8 g/dL (ref 3.5–5.0)
Alkaline Phosphatase: 91 U/L (ref 38–126)
Anion gap: 10 (ref 5–15)
BUN: 21 mg/dL (ref 8–23)
CO2: 32 mmol/L (ref 22–32)
Calcium: 9.5 mg/dL (ref 8.9–10.3)
Chloride: 101 mmol/L (ref 98–111)
Creatinine: 0.77 mg/dL (ref 0.44–1.00)
GFR, Estimated: >60 mL/min (ref >60)
Glucose, Bld: 100 mg/dL — ABNORMAL HIGH (ref 70–99)
Potassium: 3.4 mmol/L — ABNORMAL LOW (ref 3.5–5.1)
Sodium: 143 mmol/L (ref 135–145)
Total Bilirubin: 0.3 mg/dL (ref 0.3–1.2)
Total Protein: 7.1 g/dL (ref 6.5–8.1)

## 2020-06-30 MED ORDER — ANASTROZOLE 1 MG PO TABS: 1.0000 mg | ORAL_TABLET | Freq: Every day | ORAL | 4 refills | Status: DC

## 2020-07-02 ENCOUNTER — Telehealth: Payer: Self-pay | Admitting: Oncology

## 2020-07-02 NOTE — Telephone Encounter (Signed)
Scheduled per 3/28 los. Called and spoke with pt confirmed 5/25 appt

## 2020-07-18 ENCOUNTER — Ambulatory Visit
Admission: RE | Admit: 2020-07-18 | Discharge: 2020-07-18 | Disposition: A | Discharge status: Home / Self Care | Payer: Medicare Other | Source: Ambulatory Visit | Attending: Oncology | Admitting: Oncology

## 2020-07-18 ENCOUNTER — Other Ambulatory Visit: Payer: Self-pay

## 2020-07-18 DIAGNOSIS — C50212 Malignant neoplasm of upper-inner quadrant of left female breast: Secondary | ICD-10-CM

## 2020-10-11 ENCOUNTER — Encounter (HOSPITAL_COMMUNITY): Payer: Self-pay

## 2021-06-16 ENCOUNTER — Other Ambulatory Visit: Payer: Self-pay

## 2021-06-16 DIAGNOSIS — C50212 Malignant neoplasm of upper-inner quadrant of left female breast: Secondary | ICD-10-CM

## 2021-06-16 NOTE — Progress Notes (Signed)
Pt's husband called to get pt scheduled for routine MM. Orders placed per MD. He verbalized thanks and understanding.  ?

## 2021-07-27 ENCOUNTER — Ambulatory Visit
Admission: RE | Admit: 2021-07-27 | Discharge: 2021-07-27 | Disposition: A | Discharge status: Home / Self Care | Payer: Medicare Other | Source: Ambulatory Visit | Attending: Hematology and Oncology | Admitting: Hematology and Oncology

## 2021-07-27 DIAGNOSIS — Z170 Estrogen receptor positive status [ER+]: Secondary | ICD-10-CM

## 2021-07-28 ENCOUNTER — Telehealth: Payer: Self-pay | Admitting: Adult Health

## 2021-07-28 NOTE — Telephone Encounter (Signed)
Rescheduled appointment per provider template. Patient is aware of the changes made to her upcoming appointment 

## 2021-08-17 ENCOUNTER — Other Ambulatory Visit: Payer: Self-pay

## 2021-08-17 MED ORDER — ANASTROZOLE 1 MG PO TABS: 1.0000 mg | ORAL_TABLET | Freq: Every day | ORAL | 0 refills | Status: DC

## 2021-08-26 ENCOUNTER — Other Ambulatory Visit: Payer: Self-pay | Admitting: *Deleted

## 2021-08-26 DIAGNOSIS — C50212 Malignant neoplasm of upper-inner quadrant of left female breast: Secondary | ICD-10-CM

## 2021-08-27 ENCOUNTER — Other Ambulatory Visit: Payer: Self-pay

## 2021-08-27 ENCOUNTER — Inpatient Hospital Stay: Payer: Medicare Other | Admitting: Adult Health

## 2021-08-27 ENCOUNTER — Other Ambulatory Visit: Payer: Medicare Other

## 2021-08-27 ENCOUNTER — Inpatient Hospital Stay: Payer: Medicare Other | Attending: Adult Health

## 2021-08-27 ENCOUNTER — Encounter: Payer: Self-pay | Admitting: Adult Health

## 2021-08-27 ENCOUNTER — Ambulatory Visit: Payer: Medicare Other | Admitting: Adult Health

## 2021-08-27 VITALS — BP 137/54 | HR 70 | Temp 98.5°F | Resp 18 | Ht 60.0 in | Wt 122.0 lb

## 2021-08-27 DIAGNOSIS — Z170 Estrogen receptor positive status [ER+]: Secondary | ICD-10-CM | POA: Insufficient documentation

## 2021-08-27 DIAGNOSIS — Z853 Personal history of malignant neoplasm of breast: Secondary | ICD-10-CM | POA: Diagnosis not present

## 2021-08-27 DIAGNOSIS — M810 Age-related osteoporosis without current pathological fracture: Secondary | ICD-10-CM | POA: Insufficient documentation

## 2021-08-27 DIAGNOSIS — F17210 Nicotine dependence, cigarettes, uncomplicated: Secondary | ICD-10-CM | POA: Diagnosis not present

## 2021-08-27 DIAGNOSIS — Z79811 Long term (current) use of aromatase inhibitors: Secondary | ICD-10-CM | POA: Diagnosis not present

## 2021-08-27 DIAGNOSIS — C50212 Malignant neoplasm of upper-inner quadrant of left female breast: Secondary | ICD-10-CM | POA: Diagnosis not present

## 2021-08-27 LAB — CMP (CANCER CENTER ONLY)
ALT: 22 U/L (ref 0–44)
AST: 15 U/L (ref 15–41)
Albumin: 4.1 g/dL (ref 3.5–5.0)
Alkaline Phosphatase: 108 U/L (ref 38–126)
Anion gap: 7 (ref 5–15)
BUN: 14 mg/dL (ref 8–23)
CO2: 36 mmol/L — ABNORMAL HIGH (ref 22–32)
Calcium: 9.7 mg/dL (ref 8.9–10.3)
Chloride: 98 mmol/L (ref 98–111)
Creatinine: 0.94 mg/dL (ref 0.44–1.00)
GFR, Estimated: >60 mL/min (ref >60)
Glucose, Bld: 140 mg/dL — ABNORMAL HIGH (ref 70–99)
Potassium: 3.5 mmol/L (ref 3.5–5.1)
Sodium: 141 mmol/L (ref 135–145)
Total Bilirubin: 0.6 mg/dL (ref 0.3–1.2)
Total Protein: 7.1 g/dL (ref 6.5–8.1)

## 2021-08-27 LAB — CBC WITH DIFFERENTIAL (CANCER CENTER ONLY)
Abs Immature Granulocytes: 0.05 10*3/uL (ref 0.00–0.07)
Basophils Absolute: 0 10*3/uL (ref 0.0–0.1)
Basophils Relative: 0 %
Eosinophils Absolute: 0.1 10*3/uL (ref 0.0–0.5)
Eosinophils Relative: 1 %
HCT: 44.1 % (ref 36.0–46.0)
Hemoglobin: 14.9 g/dL (ref 12.0–15.0)
Immature Granulocytes: 1 %
Lymphocytes Relative: 19 %
Lymphs Abs: 2.1 10*3/uL (ref 0.7–4.0)
MCH: 31.6 pg (ref 26.0–34.0)
MCHC: 33.8 g/dL (ref 30.0–36.0)
MCV: 93.4 fL (ref 80.0–100.0)
Monocytes Absolute: 0.7 10*3/uL (ref 0.1–1.0)
Monocytes Relative: 6 %
Neutro Abs: 8 10*3/uL — ABNORMAL HIGH (ref 1.7–7.7)
Neutrophils Relative %: 73 %
Platelet Count: 216 10*3/uL (ref 150–400)
RBC: 4.72 MIL/uL (ref 3.87–5.11)
RDW: 13.2 % (ref 11.5–15.5)
WBC Count: 11 10*3/uL — ABNORMAL HIGH (ref 4.0–10.5)
nRBC: 0 % (ref 0.0–0.2)

## 2021-08-27 NOTE — Progress Notes (Signed)
[facility] Follow up:    Sueanne Margarita, [place] [address]   DIAGNOSIS:  Cancer Staging  Malignant neoplasm of upper-inner quadrant of left breast in female, estrogen receptor positive ([place]) Staging form: Breast, AJCC 8th Edition - Clinical stage from 07/25/2019: Stage IA (cT1a, cN0, cM0, G2, ER+, PR+, HER2-) - Unsigned Stage prefix: Initial diagnosis Histologic grading system: 3 grade system Laterality: Left Staged by: Pathologist and managing physician Stage used in treatment planning: Yes National guidelines used in treatment planning: Yes Type of national guideline used in treatment planning: NCCN   SUMMARY OF ONCOLOGIC HISTORY:  [place] woman   (1) status post right lumpectomy and sentinel lymph node biopsy July of 2008 for a T1b N0, estrogen receptor positive, progesterone receptor and HER-2 negative invasive lobular breast cancer (stage IA), grade 1, status post radiation, on tamoxifen from November of 2008 through December of 2013 with excellent tolerance.   (2) status post left breast upper inner quadrant biopsy 07/18/2019 for a clinical T1a NX, stage IA invasive ductal carcinoma, grade 2, estrogen and progesterone receptor positive, HER-2 not amplified, with an MIB-1 of 1%   (3) status post left lumpectomy and sentinel lymph node dissection 08/16/2019 for a pT1C pN0, stage  IA invasive ductal carcinoma, grade 2, with negative margins.   (4) adjuvant radiation not indicated (patient plans to take antiestrogens for 5 years)   (5) anastrozole started 09/19/2019             (a) bone density 09/30/2015 showed a T score of -2.2             (b) bone density 12/26/2019 shows a T score of -2.5             (c) alendronate started March 2022  CURRENT THERAPY: Anastrozole  INTERVAL HISTORY: Wyoma Genson Weidner 81 y.o. female returns for follow up of her h/o bilateral breast cancer.  She underwent bilateral breast diagnostic  mammography on 07/27/2021 that shoed no findings suspicious for malignancy and breast density category B.   She is taking anastrozole daily and tolerates this well.  She does experience occasional night sweats that are manageable for her.  She denies any other difficulty with this.  She is seeing her primary care provider regularly and is up-to-date with her other cancer screenings.  She is due for bone density testing in September 2023 and continues on Fosamax weekly with good tolerance.  She has no questions or concerns today.  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  MEDICAL HISTORY: Past Medical History:  Diagnosis Date   AAA (abdominal aortic aneurysm) without rupture [facility])    vascular consult w/ dr Trula Slade 12-19-2017,  measures 2.8cm   Balance problem    uses cane   Bladder tumor    Breast cancer, stage 1, estrogen receptor positive, right [facility]) oncologist-  dr Jana Hakim   dx 07/ 2008----  Stage IA (T1b,N0)  Grade 1,  invasive lobular carcinoma--- s/p  right breast lumpectomy w/ sln dissections 10-13-2006  and completed radiation 10/ 2008,  completed tamoxifen 2016   Centrilobular emphysema ([place])    per CT 12-07-2017   COPD (chronic obstructive pulmonary disease) ([facility])    Dyspnea    with  exertion   Frequency of urination    Full dentures    Hematuria    History of external beam radiation therapy    completed 10/ 2008  right breast cancer   History of TIAs    12-29-2017 per pt never had symptoms, but pcp found per MRI had 3 TIAs,  residual balance issues   Hyperlipidemia    Hypertension    Stroke [facility]) few yrs agi   left sided weakness and speech    Wears glasses     SURGICAL HISTORY: Past Surgical History:  Procedure  Laterality Date   ABDOMINAL HYSTERECTOMY  1980s   W/ UNILATERAL SALPINGOOPHORECTOMY   APPENDECTOMY  many yrs ago   BREAST LUMPECTOMY WITH NEEDLE LOCALIZATION AND AXILLARY LYMPH NODE DISSECTION Right 10-13-2006   dr cornett  @[facility]    BREAST LUMPECTOMY WITH RADIOACTIVE SEED AND SENTINEL LYMPH NODE BIOPSY Left 08/16/2019   Procedure: BREAST LUMPECTOMY WITH RADIOACTIVE SEED AND SENTINEL LYMPH NODE BIOPSY;  Surgeon: Erroll Luna, MD;  Location: [facility];  Service: General;  Laterality: Left;  GEN PEC   SHOULDER OPEN ROTATOR CUFF REPAIR Left early 2000s   TONSILLECTOMY  child   TRANSURETHRAL RESECTION OF BLADDER TUMOR N/A 02/03/2018   Procedure: RESTAGE TRANSURETHRAL RESECTION OF BLADDER TUMOR (TURBT);  Surgeon: Lucas Mallow, MD;  Location: [facility];  Service: Urology;  Laterality: N/A;   TRANSURETHRAL RESECTION OF BLADDER TUMOR WITH MITOMYCIN-C N/A 01/02/2018   Procedure: TRANSURETHRAL RESECTION OF BLADDER TUMOR WITH GEMCITABINE;  Surgeon: Lucas Mallow, MD;  Location: [facility];  Service: Urology;  Laterality: N/A;    SOCIAL HISTORY: Social History   Socioeconomic History   Marital status: Married    Spouse name: Not on file   Number of children: Not on file   Years of education: Not on file   Highest education level: Not on file  Occupational History   Not on file  Tobacco Use   Smoking status: Every Day    Packs/day: 0.25    Years: 60.00    Pack years: 15.00    Types: Cigarettes   Smokeless tobacco: Never   Tobacco comments:    12-29-2017 per pt 4-5 cig per day  Vaping Use   Vaping Use: Never used  Substance and Sexual Activity   Alcohol use: Yes    Comment: very rare   Drug use: Never   Sexual activity: Not on file  Other Topics Concern   Not on file  Social History Narrative   Not on file   Social Determinants of Health   Financial Resource Strain: Not on file  Food Insecurity: Not on file  Transportation  Needs: Not on file  Physical Activity: Not on file  Stress: Not on file  Social Connections: Not on file  Intimate Partner Violence: Not on file    FAMILY HISTORY: No family history on file.  Review of Systems  Constitutional:  Negative for appetite change, chills, fatigue, fever and unexpected weight change.  HENT:   Negative for hearing loss, lump/mass and trouble swallowing.   Eyes:  Negative for eye problems and icterus.  Respiratory:  Positive for cough (Occasional and related to COPD). Negative for chest tightness and shortness of breath.   Cardiovascular:  Negative for chest pain, leg swelling and palpitations.  Gastrointestinal:  Negative for abdominal distention, abdominal pain, constipation, diarrhea, nausea and vomiting.  Endocrine: Positive for hot flashes (Occasional).  Genitourinary:  Negative for difficulty urinating.  Musculoskeletal:  Negative for arthralgias.  Skin:  Negative for itching and rash.  Neurological:  Negative for dizziness, extremity weakness, headaches and numbness.  Hematological:  Negative for adenopathy. Does not bruise/bleed easily.  Psychiatric/Behavioral:  Negative for depression. The patient is not nervous/anxious.      PHYSICAL EXAMINATION  ECOG PERFORMANCE STATUS: 1 - Symptomatic but completely ambulatory  Vitals:   08/27/21 1313  BP: (!) 137/54  Pulse: 70  Resp: 18  Temp: 98.5 F (36.9 C)  SpO2: 94%    Physical Exam Constitutional:      General: She is not in acute distress.    Appearance: Normal appearance. She is not toxic-appearing.  HENT:     Head: Normocephalic and atraumatic.  Eyes:     General: No scleral icterus. Cardiovascular:     Rate and Rhythm: Normal rate and regular rhythm.     Pulses: Normal pulses.     Heart sounds: Normal heart sounds.  Pulmonary:     Effort: Pulmonary effort is normal.     Breath sounds: Normal breath sounds.  Chest:     Comments: Status post bilateral lumpectomies no sign of local  recurrence benign breast exam Abdominal:     General: Abdomen is flat. Bowel sounds are normal. There is no distension.     Palpations: Abdomen is soft.     Tenderness: There is no abdominal tenderness.  Musculoskeletal:        General: No swelling.     Cervical back: Neck supple.  Lymphadenopathy:     Cervical: No cervical adenopathy.  Skin:    General: Skin is warm and dry.     Findings: No rash.  Neurological:     General: No focal deficit present.     Mental Status: She is alert.  Psychiatric:        Mood and Affect: Mood normal.        Behavior: Behavior normal.    LABORATORY DATA:  CBC    Component Value Date/Time   WBC 11.0 (H) 08/27/2021 1239   WBC 10.1 08/14/2019 1156   RBC 4.72 08/27/2021 1239   HGB 14.9 08/27/2021 1239   HGB 14.1 02/28/2012 1352   HCT 44.1 08/27/2021 1239   HCT 41.5 02/28/2012 1352   PLT 216 08/27/2021 1239   PLT 308 02/28/2012 1352   MCV 93.4 08/27/2021 1239   MCV 92.5 02/28/2012 1352   MCH 31.6 08/27/2021 1239   MCHC 33.8 08/27/2021 1239   RDW 13.2 08/27/2021 1239   RDW 14.3 02/28/2012 1352   LYMPHSABS 2.1 08/27/2021 1239   LYMPHSABS 3.2 02/28/2012 1352   MONOABS 0.7 08/27/2021 1239   MONOABS 0.7 02/28/2012 1352   EOSABS 0.1 08/27/2021 1239   EOSABS 0.2 02/28/2012 1352   BASOSABS 0.0 08/27/2021 1239   BASOSABS 0.1 02/28/2012 1352    CMP     Component Value Date/Time   NA 141 08/27/2021 1239   NA 142 02/28/2012 1352   K 3.5 08/27/2021 1239   K 3.7 02/28/2012 1352   CL 98 08/27/2021 1239   CL 98 02/28/2012 1352   CO2 36 (H) 08/27/2021 1239   CO2 33 (H) 02/28/2012 1352   GLUCOSE 140 (H) 08/27/2021 1239   GLUCOSE 101 (H) 02/28/2012 1352   BUN 14 08/27/2021 1239   BUN 16.0 02/28/2012 1352   CREATININE 0.94 08/27/2021 1239   CREATININE 0.8 02/28/2012 1352   CALCIUM 9.7 08/27/2021 1239   CALCIUM 9.8 02/28/2012 1352   PROT 7.1 08/27/2021 1239  PROT 6.8 02/28/2012 1352   ALBUMIN 4.1 08/27/2021 1239   ALBUMIN 3.5 02/28/2012  1352   AST 15 08/27/2021 1239   AST 14 02/28/2012 1352   ALT 22 08/27/2021 1239   ALT 14 02/28/2012 1352   ALKPHOS 108 08/27/2021 1239   ALKPHOS 75 02/28/2012 1352   BILITOT 0.6 08/27/2021 1239   BILITOT 0.34 02/28/2012 1352   GFRNONAA >60 08/27/2021 1239   GFRAA >60 12/25/2019 1252      ASSESSMENT and THERAPY PLAN:   Malignant neoplasm of upper-inner quadrant of left breast in female, estrogen receptor positive ([place]) Lean is an 81 year old woman who is here today for follow-up of her stage Ia grade 2 estrogen and progesterone positive invasive ductal carcinoma diagnosed in April 2021.  She is status post left lumpectomy, and antiestrogen therapy with anastrozole beginning in June 2021.  Vallerie is tolerating her treatment with anastrozole daily without any difficulty.  Her next mammogram is due in April 2023.  We discussed staying up-to-date with her health maintenance, healthy diet, and exercise.  We will see her back in 1 year for continued follow-up.  Osteoporosis Kenneth has osteoporosis with her bone density testing in September 2021 showing a T score of -2.5.  She has been taking Fosamax since March 2022 with good tolerance.  I recommended that she continue taking this therapy.  She is due for bone density testing again in September 2023.  I placed orders for this to be completed at the breast center where she had this completed previously.   All questions were answered. The patient knows to call the clinic with any problems, questions or concerns. We can certainly see the patient much sooner if necessary.  Total encounter time:30 minutes*in face-to-face visit time, chart review, lab review, care coordination, order entry, and documentation of the encounter time.  Wilber Bihari, NP 08/27/21 1:45 PM Medical Oncology and Hematology [facility] [place], [place] [address] Tel. [phone]    Fax. [phone]  *Total Encounter Time as  defined by the Centers for Medicare and Medicaid Services includes, in addition to the face-to-face time of a patient visit (documented in the note above) non-face-to-face time: obtaining and reviewing outside history, ordering and reviewing medications, tests or procedures, care coordination (communications with other health care professionals or caregivers) and documentation in the medical record.

## 2021-08-27 NOTE — Assessment & Plan Note (Signed)
Debra Keller is an 81 year old woman who is here today for follow-up of her stage Ia grade 2 estrogen and progesterone positive invasive ductal carcinoma diagnosed in April 2021.  She is status post left lumpectomy, and antiestrogen therapy with anastrozole beginning in June 2021.  Ercia is tolerating her treatment with anastrozole daily without any difficulty.  Her next mammogram is due in April 2023.  We discussed staying up-to-date with her health maintenance, healthy diet, and exercise.  We will see her back in 1 year for continued follow-up.

## 2021-08-27 NOTE — Assessment & Plan Note (Signed)
Debra Keller has osteoporosis with her bone density testing in September 2021 showing a T score of -2.5.  She has been taking Fosamax since March 2022 with good tolerance.  I recommended that she continue taking this therapy.  She is due for bone density testing again in September 2023.  I placed orders for this to be completed at the breast center where she had this completed previously.

## 2021-08-28 ENCOUNTER — Telehealth: Payer: Self-pay | Admitting: Adult Health

## 2021-08-28 NOTE — Telephone Encounter (Signed)
Scheduled appointment per 5/25 los. Patient is aware.

## 2021-11-13 ENCOUNTER — Other Ambulatory Visit: Payer: Self-pay | Admitting: Adult Health

## 2022-02-12 ENCOUNTER — Other Ambulatory Visit: Payer: Self-pay | Admitting: Adult Health

## 2022-02-15 IMAGING — US US BREAST*L* LIMITED INC AXILLA
1 series · 13 of 15 positions shown · non-contrast
Comparison: Previous exam(s).

CLINICAL DATA: Possible mass with distortion upper, slightly medial
left breast on a recent screening mammogram. The patient recently
had her 2nd 30PRL-2I vaccine in the left arm. The 1st vaccine was
also in the left arm.

EXAM:
DIGITAL DIAGNOSTIC LEFT MAMMOGRAM WITH TOMO
ULTRASOUND LEFT BREAST

[Series 1: us breast*left* limited inc axilla · 0.07mm/px · 13 of 15 slices shown]
[im 1/15]
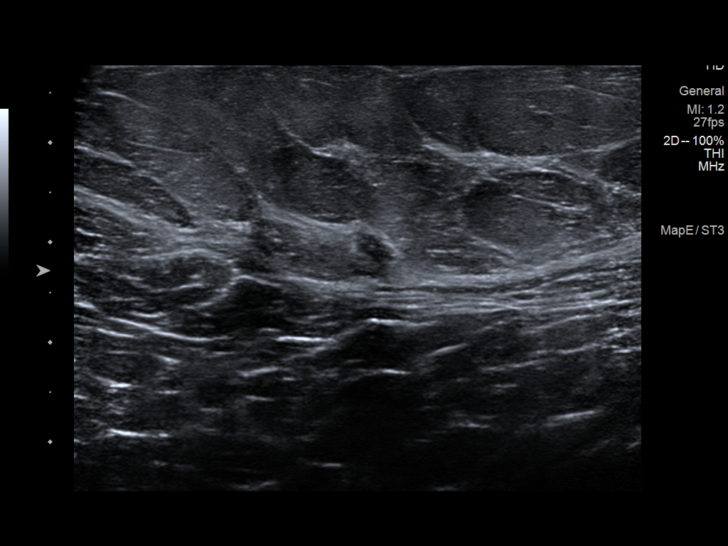
[im 2/15]
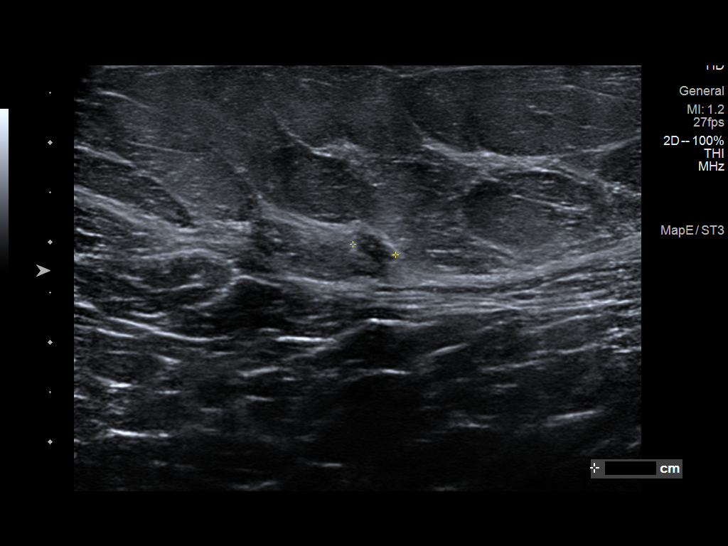
[im 3/15]
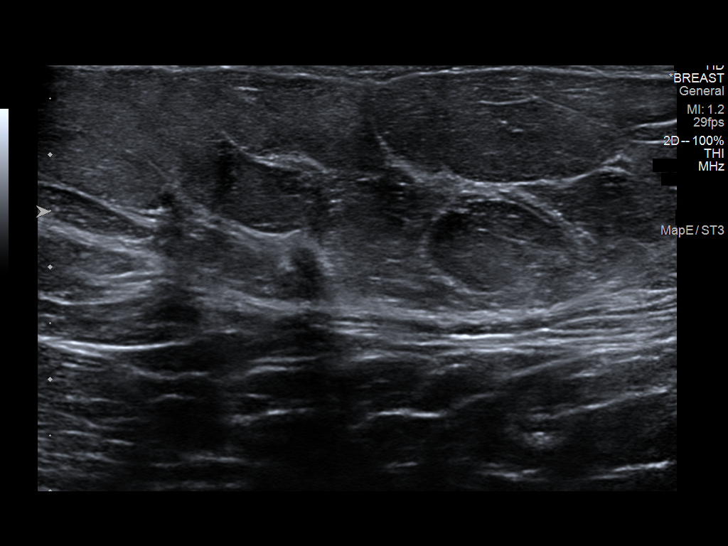
[im 5/15]
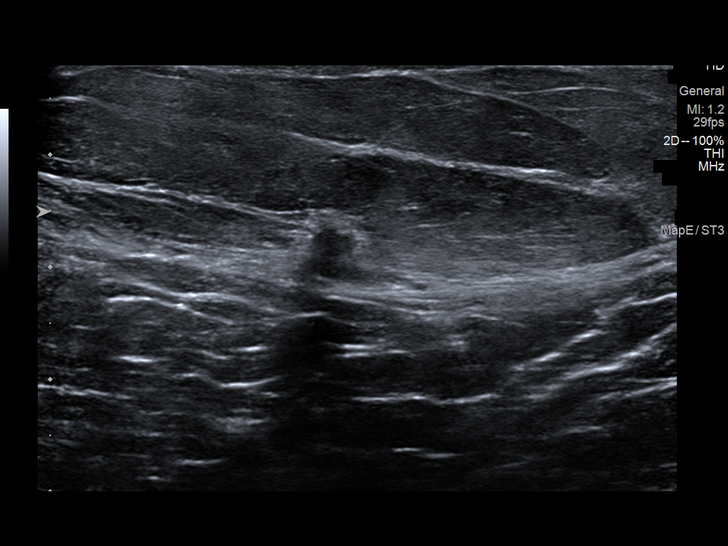
[im 6/15]
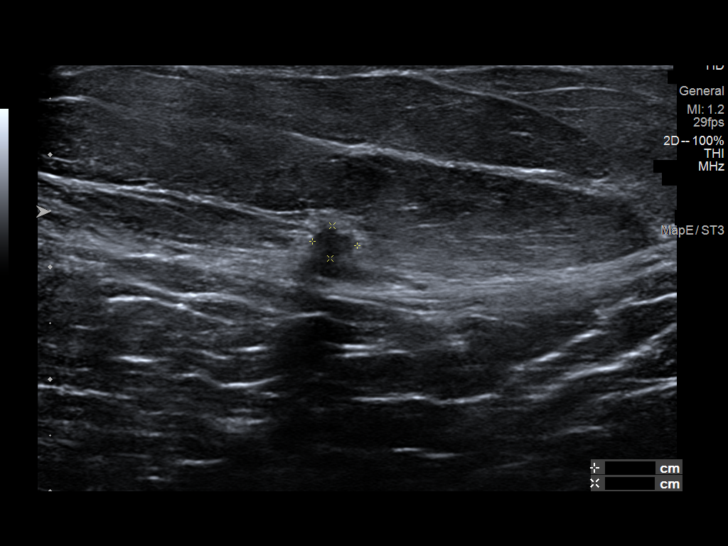
[im 7/15]
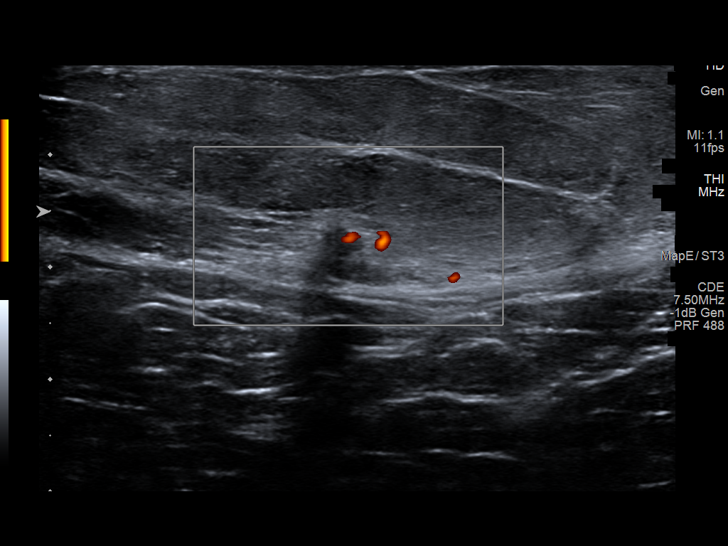
[im 8/15]
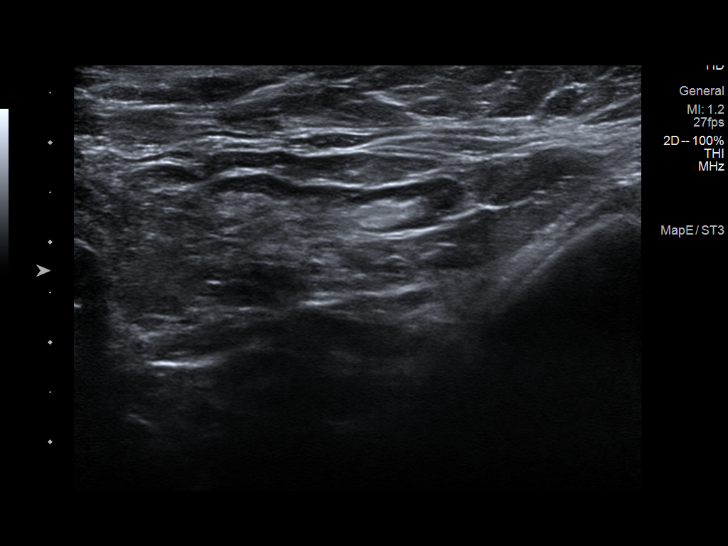
[im 9/15]
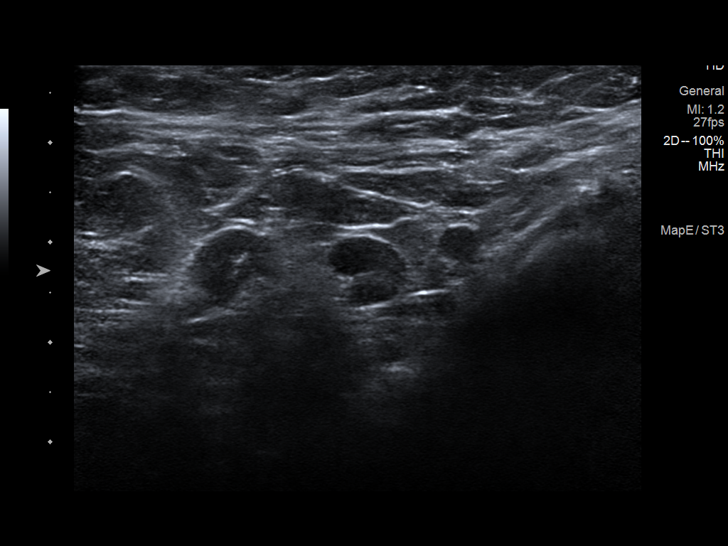
[im 10/15]
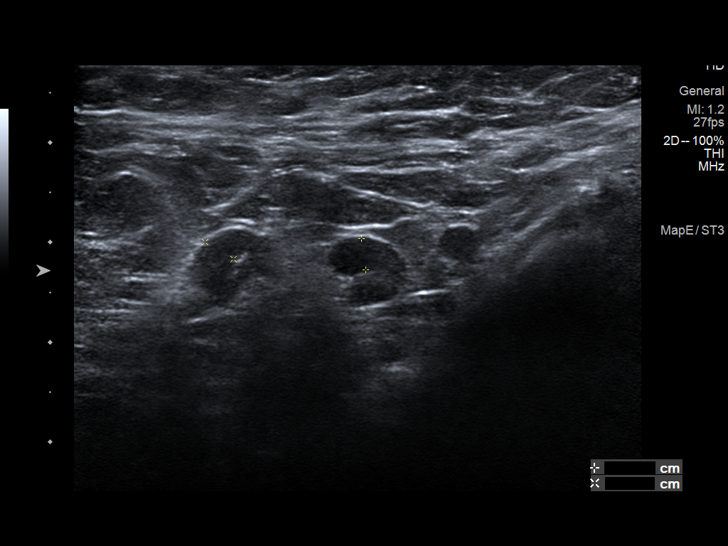
[im 11/15]
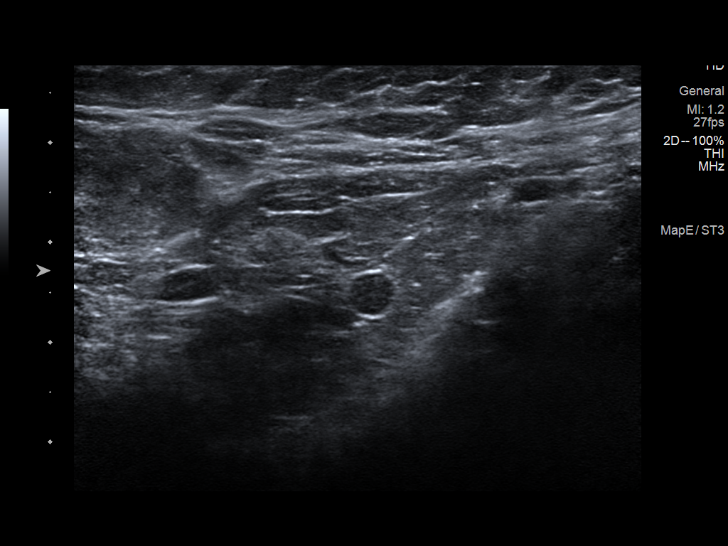
[im 13/15]
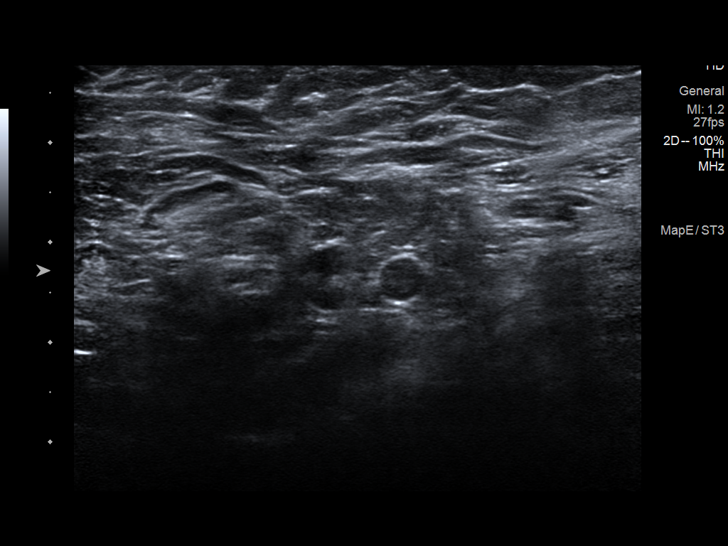
[im 14/15]
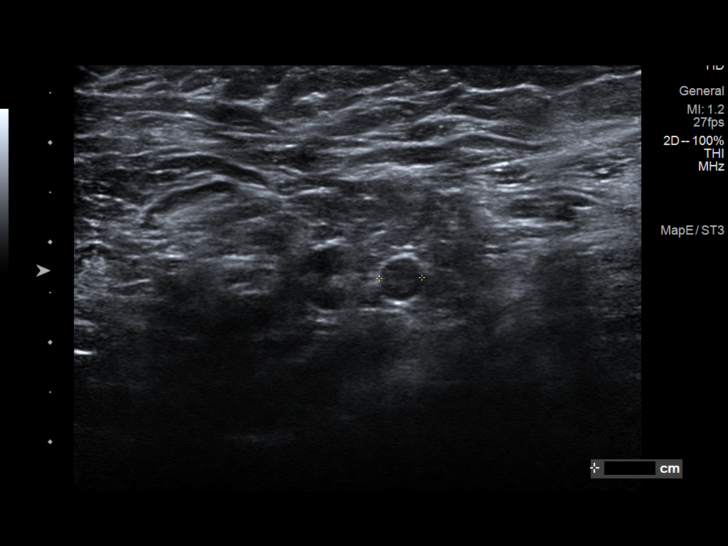
[im 15/15]
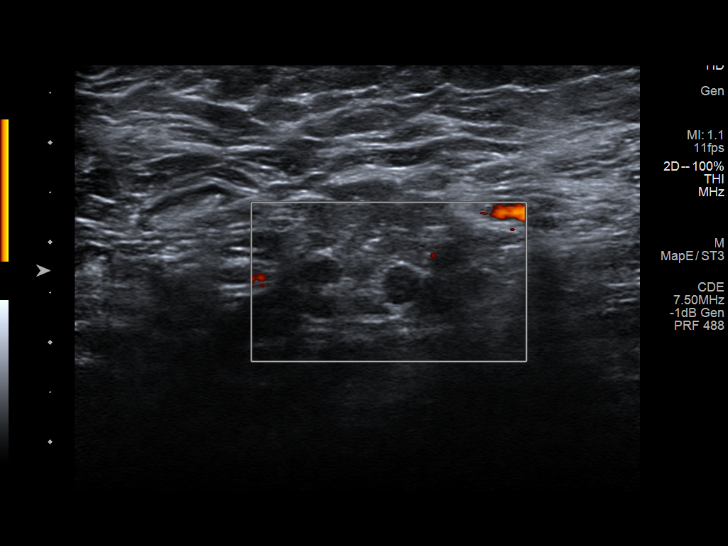

[13 of 15 positions shown; findings below may reference images not displayed]

ACR Breast Density Category b: There are scattered areas of
fibroglandular density.
FINDINGS: 3D tomographic and 2D generated spot compression views of the left
breast confirm a small, irregular, spiculated mass in the upper,
slightly inner aspect of the breast in the middle 3rd.

On physical exam, no mass palpable in the left breast or left
axilla.

Targeted ultrasound is performed, showing a 4 x 4 x 3 mm irregular,
hypoechoic mass mild surrounding ill-defined increased echogenicity
in the 11:30 o'clock position of the left breast, 7 cm from the
nipple. This corresponds to the mammographic mass.

Also demonstrated are 3 adjacent left axillary lymph nodes with mild
diffuse and eccentric cortical thickening, including a small node
without a visible fatty hilum. The other 2 nodes have compressed
hila. The maximum cortical thickness is 3.3 mm. Other left axillary
lymph nodes have normal, thin cortices.
IMPRESSION: 1. 4 mm spiculated mass in the 11:30 o'clock position of the left
breast with imaging features highly suspicious for malignancy.
2. 3 abnormal left axillary lymph nodes. These could represent
metastatic lymph nodes or reactive nodes related to the patient's
recent 30PRL-2I vaccine.

RECOMMENDATION:
1. Ultrasound-guided core needle biopsy of the 4 mm mass in the
11:30 o'clock position of the left breast.
2. Ultrasound-guided core needle biopsy of 1 of the abnormal left
axillary lymph nodes. If the lymph nodes have a more normal
appearance when the patient returns for the biopsy, compatible with
reactive lymph nodes, the lymph node biopsy may not need to be
performed. This has been discussed with the patient. The biopsies
have been scheduled at [DATE] p.m. on 07/18/2019.

I have discussed the findings and recommendations with the patient.
If applicable, a reminder letter will be sent to the patient
regarding the next appointment.

BI-RADS CATEGORY  5: Highly suggestive of malignancy.

## 2022-02-24 ENCOUNTER — Ambulatory Visit
Admission: RE | Admit: 2022-02-24 | Discharge: 2022-02-24 | Disposition: A | Discharge status: Home / Self Care | Payer: Medicare Other | Source: Ambulatory Visit | Attending: Adult Health | Admitting: Adult Health

## 2022-02-24 DIAGNOSIS — M810 Age-related osteoporosis without current pathological fracture: Secondary | ICD-10-CM

## 2022-02-26 ENCOUNTER — Telehealth: Payer: Self-pay | Admitting: Adult Health

## 2022-02-26 ENCOUNTER — Telehealth: Payer: Self-pay | Admitting: *Deleted

## 2022-02-26 NOTE — Telephone Encounter (Signed)
Scheduled appointment per 11/24 staff message. Patient is aware.

## 2022-02-26 NOTE — Telephone Encounter (Addendum)
Scheduling request sent per request=  ----- Message from Gardenia Phlegm, NP sent at 02/26/2022  7:54 AM EST ----- Please set up virtual visit next week to discuss bone density testing results ----- Message ----- From: Interface, Rad Results In Sent: 02/24/2022   1:46 PM EST To: Gardenia Phlegm, NP

## 2022-03-05 ENCOUNTER — Inpatient Hospital Stay: Payer: Medicare Other | Attending: Adult Health | Admitting: Adult Health

## 2022-03-05 DIAGNOSIS — M810 Age-related osteoporosis without current pathological fracture: Secondary | ICD-10-CM

## 2022-03-05 DIAGNOSIS — C50212 Malignant neoplasm of upper-inner quadrant of left female breast: Secondary | ICD-10-CM | POA: Diagnosis not present

## 2022-03-05 DIAGNOSIS — Z170 Estrogen receptor positive status [ER+]: Secondary | ICD-10-CM | POA: Diagnosis not present

## 2022-03-05 NOTE — Progress Notes (Signed)
[facility]:    Debra Keller, Debra Keller   DIAGNOSIS:  Cancer Staging  Malignant neoplasm of upper-inner quadrant of left breast in female, estrogen receptor positive ([place]) Staging form: Breast, AJCC 8th Edition - Clinical stage from 07/25/2019: Stage IA (cT1a, cN0, cM0, G2, ER+, PR+, HER2-) - Unsigned Stage prefix: Initial diagnosis Histologic grading system: 3 grade system Laterality: Left Staged by: Pathologist and managing physician Stage used in treatment planning: Yes National guidelines used in treatment planning: Yes Type of national guideline used in treatment planning: NCCN  I connected with Debra Keller on 03/05/22 at  1:45 PM EST by telephone and verified that I am speaking with the correct person using two identifiers.  I discussed the limitations, risks, security and privacy concerns of performing an evaluation and management service by telephone and the availability of in person appointments.  I also discussed with the patient that there may be a patient responsible charge related to this service. The patient expressed understanding and agreed to proceed.  Patient location:  Provider location: [facility] office  SUMMARY OF ONCOLOGIC HISTORY:  Debra Keller   (1) status post right lumpectomy and sentinel lymph node biopsy July of 2008 for a T1b N0, estrogen receptor positive, progesterone receptor and HER-2 negative invasive lobular breast cancer (stage IA), grade 1, status post radiation, on tamoxifen from November of 2008 through December of 2013 with excellent tolerance.   (2) status post left breast upper inner quadrant biopsy 07/18/2019 for a clinical T1a NX, stage IA invasive ductal carcinoma, grade 2, estrogen and progesterone receptor positive, HER-2 not amplified, with an MIB-1 of 1%   (3) status post left lumpectomy and sentinel lymph node dissection 08/16/2019 for a pT1C pN0, stage  IA  invasive ductal carcinoma, grade 2, with negative margins.   (4) adjuvant radiation not indicated (patient plans to take antiestrogens for 5 years)   (5) anastrozole started 09/19/2019             (a) bone density 09/30/2015 showed a T score of -2.2             (b) bone density 12/26/2019 shows a T score of -2.5             (c) alendronate started March 2022  CURRENT THERAPY: Anastrozole  INTERVAL HISTORY: Debra Keller 81 y.o. female returns for f/u of her bone density results.  She continues on Anastrozole daily.  She is taking fosamax weekly with good tolerance.    Her most recent bone density occurred on 02/24/2022 and showed osteoporosis.  Compared to her bone density from 12/2019, there was not a significant difference.  This was the first year that they measured her left forearm radius and that T-score was worse at -3.4 compared to -2.5 in the right femur.    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  MEDICAL HISTORY: Past Medical History:  Diagnosis Date   AAA (abdominal aortic aneurysm) without rupture [facility])    vascular consult w/ dr Trula Slade 12-19-2017,  measures 2.8cm   Balance problem    uses cane   Bladder tumor    Breast cancer, stage 1, estrogen receptor positive, right [facility]) oncologist-  dr  magrinat   dx 07/ 2008----  Stage IA (T1b,N0)  Grade 1,  invasive lobular carcinoma--- s/p  right breast lumpectomy w/ sln dissections 10-13-2006  and completed radiation 10/ 2008,  completed tamoxifen 2016   Centrilobular emphysema ([place])    per CT 12-07-2017   COPD (chronic obstructive pulmonary disease) ([place])    Dyspnea    with exertion   Frequency of urination    Full dentures    Hematuria    History of external  beam radiation therapy    completed 10/ 2008  right breast cancer   History of TIAs    12-29-2017 per pt never had symptoms, but pcp found per MRI had 3 TIAs,  residual balance issues   Hyperlipidemia    Hypertension    Stroke [facility]) few yrs agi   left sided weakness and speech    Wears glasses     SURGICAL HISTORY: Past Surgical History:  Procedure Laterality Date   ABDOMINAL HYSTERECTOMY  1980s   W/ UNILATERAL SALPINGOOPHORECTOMY   APPENDECTOMY  many yrs ago   BREAST LUMPECTOMY WITH NEEDLE LOCALIZATION AND AXILLARY LYMPH NODE DISSECTION Right 10-13-2006   dr cornett  _0    BREAST LUMPECTOMY WITH RADIOACTIVE SEED AND SENTINEL LYMPH NODE BIOPSY Left 08/16/2019   Procedure: BREAST LUMPECTOMY WITH RADIOACTIVE SEED AND SENTINEL LYMPH NODE BIOPSY;  Surgeon: Erroll Luna, MD;  Location: [place];  Service: General;  Laterality: Left;  GEN PEC   SHOULDER OPEN ROTATOR CUFF REPAIR Left early 2000s   TONSILLECTOMY  child   TRANSURETHRAL RESECTION OF BLADDER TUMOR N/A 02/03/2018   Procedure: RESTAGE TRANSURETHRAL RESECTION OF BLADDER TUMOR (TURBT);  Surgeon: Lucas Mallow, MD;  Location: [facility];  Service: Urology;  Laterality: N/A;   TRANSURETHRAL RESECTION OF BLADDER TUMOR WITH MITOMYCIN-C N/A 01/02/2018   Procedure: TRANSURETHRAL RESECTION OF BLADDER TUMOR WITH GEMCITABINE;  Surgeon: Lucas Mallow, MD;  Location: [facility];  Service: Urology;  Laterality: N/A;    SOCIAL HISTORY: Social History   Socioeconomic History   Marital status: Married    Spouse name: Not on file   Number of children: Not on file   Years of education: Not on file   Highest education level: Not on file  Occupational History   Not on file  Tobacco Use   Smoking status: Every Day    Packs/day: 0.25    Years: 60.00    Total pack years: 15.00    Types: Cigarettes   Smokeless tobacco: Never   Tobacco comments:    12-29-2017 per pt 4-5 cig per day   Vaping Use   Vaping Use: Never used  Substance and Sexual Activity   Alcohol use: Yes    Comment: very rare   Drug use: Never   Sexual activity: Not on file  Other Topics Concern   Not on file  Social History Narrative   Not on file   Social Determinants of Health   Financial Resource Strain: Not on file  Food Insecurity: Not on file  Transportation Needs: Not on file  Physical Activity: Not on file  Stress: Not on file  Social Connections: Not on file  Intimate Partner Violence: Not on file    FAMILY HISTORY: Noncontributory  Review of Systems  Constitutional:  Negative for appetite change, chills, fatigue, fever and unexpected weight change.  HENT:   Negative for hearing loss, lump/mass and trouble swallowing.   Eyes:  Negative for eye problems and icterus.  Respiratory:  Negative for chest tightness, cough and shortness of breath.   Cardiovascular:  Negative for chest pain, leg swelling and palpitations.  Gastrointestinal:  Negative for abdominal distention, abdominal pain, constipation, diarrhea, nausea and vomiting.  Endocrine: Negative for hot flashes.  Genitourinary:  Negative for difficulty urinating.   Musculoskeletal:  Negative for arthralgias.  Skin:  Negative for itching and rash.  Neurological:  Negative for dizziness, extremity weakness, headaches and numbness.  Hematological:  Negative for adenopathy. Does not bruise/bleed easily.  Psychiatric/Behavioral:  Negative for depression. The patient is not nervous/anxious.       PHYSICAL EXAMINATION  ECOG PERFORMANCE STATUS: 0 - Asymptomatic Patient sounds well.  She is in no apparent distress, mood and behavior are normal speech is normal, breathing is nonlabored.  LABORATORY DATA: None for this visit   ASSESSMENT and THERAPY PLAN:   Malignant neoplasm of upper-inner quadrant of left breast in female, estrogen receptor positive ([place]) She has no clinical or radiographic signs of breast cancer  recurrence.  She will continue on anastrozole daily which she is tolerating well with no concerns.  Osteoporosis Chayla has osteoporosis and continues on Fosamax with good tolerance.  We discussed her most recent bone density testing results which shows that her osteoporosis is stable.  I recommended that she continue on Fosamax and repeat bone density testing in November 2025.  Follow up instructions:    -Return to cancer center 08/2022 for f/u  -Repeat bone density testing in 02/2024   The patient was provided an opportunity to ask questions and all were answered. The patient agreed with the plan and demonstrated an understanding of the instructions.   The patient was advised to call back or seek an in-person evaluation if the symptoms worsen or if the condition fails to improve as anticipated.   I provided 5 minutes of non face-to-face telephone visit time during this encounter, and > 50% was spent counseling as documented under my assessment & plan.  Wilber Bihari, NP 03/05/22 2:05 PM Medical Oncology and Hematology [facility] [place], [place] [address] Tel. [phone]    Fax. [phone]

## 2022-03-05 NOTE — Assessment & Plan Note (Signed)
She has no clinical or radiographic signs of breast cancer recurrence.  She will continue on anastrozole daily which she is tolerating well with no concerns.

## 2022-03-05 NOTE — Assessment & Plan Note (Signed)
Dazaria has osteoporosis and continues on Fosamax with good tolerance.  We discussed her most recent bone density testing results which shows that her osteoporosis is stable.  I recommended that she continue on Fosamax and repeat bone density testing in November 2025.

## 2022-03-24 IMAGING — MG MM PLC BREAST LOC DEV 1ST LESION INC MAMMO GUIDE*L*
8 of 10 series · 8 of 10 positions shown · non-contrast
Comparison: Previous exam(s).

CLINICAL DATA: Pre lumpectomy localization of a recently diagnosed
left breast invasive mammary carcinoma.

EXAM:
MAMMOGRAPHIC GUIDED RADIOACTIVE SEED LOCALIZATION OF THE LEFT BREAST

[L LM (1 of 2)]
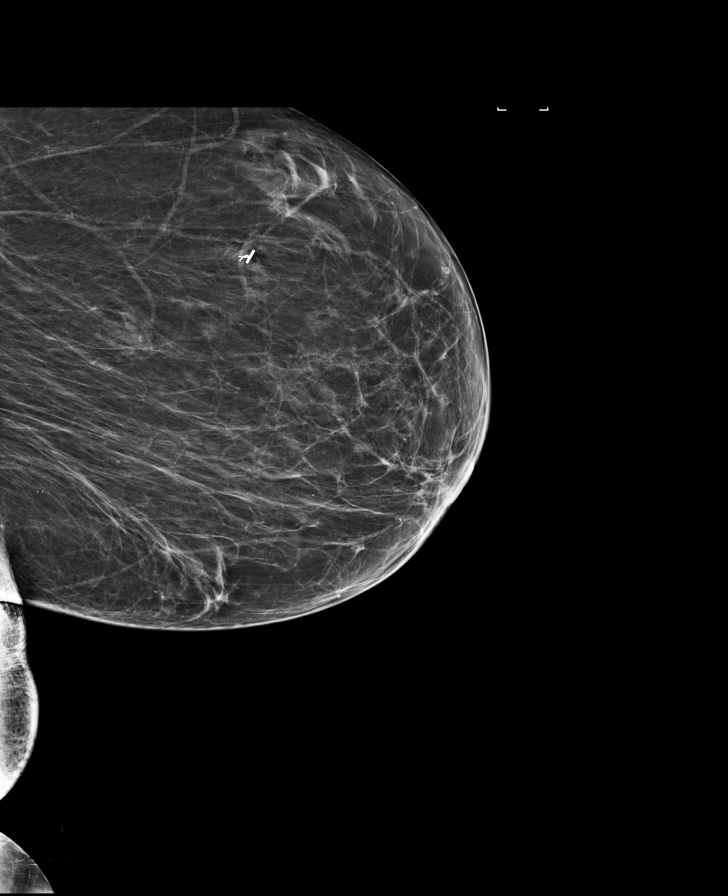

[L CC (1 of 6)]
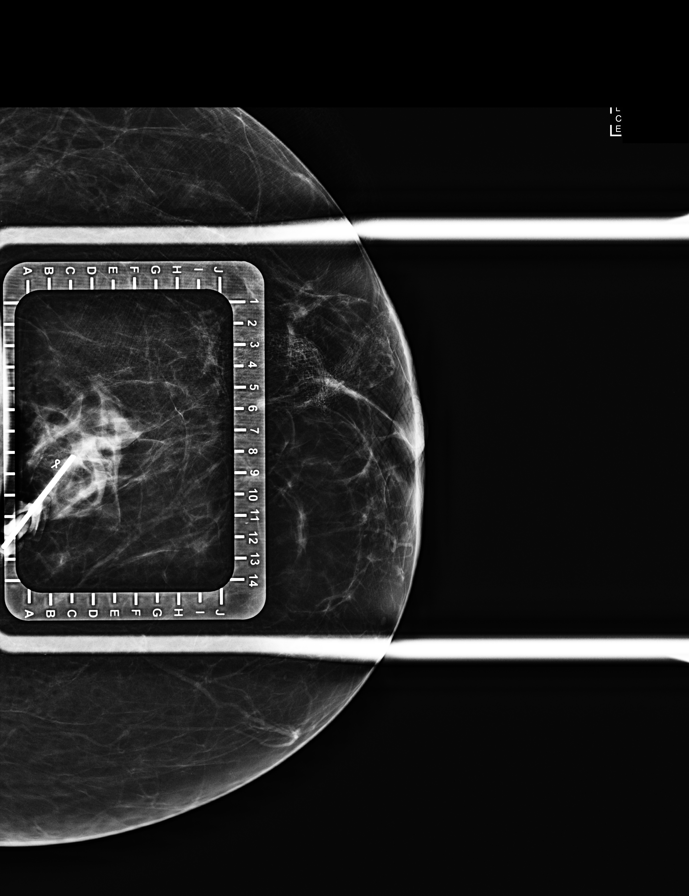

[L CC (2 of 6)]
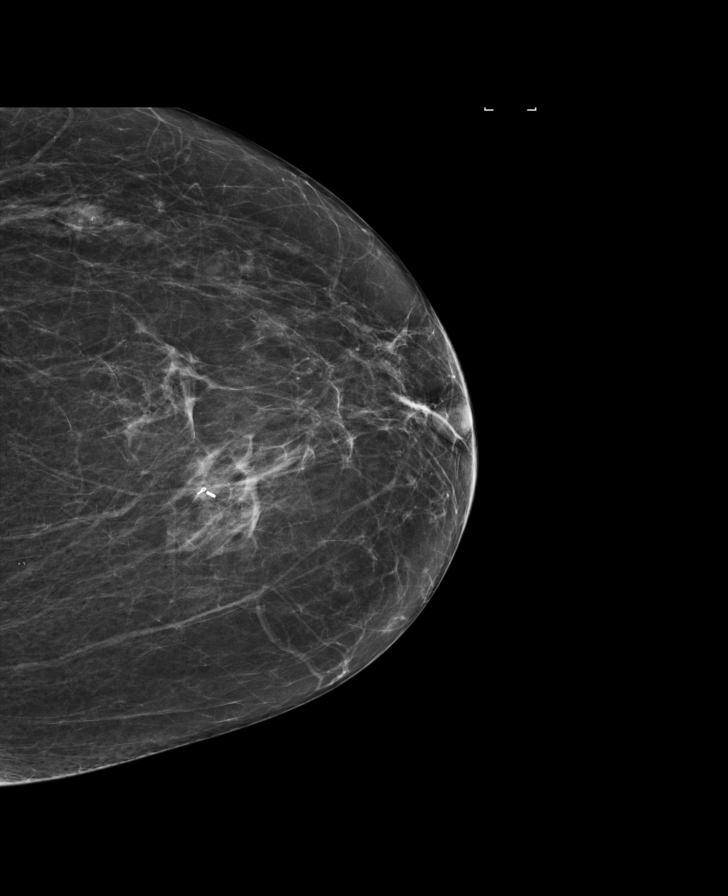

[L CC (3 of 6)]
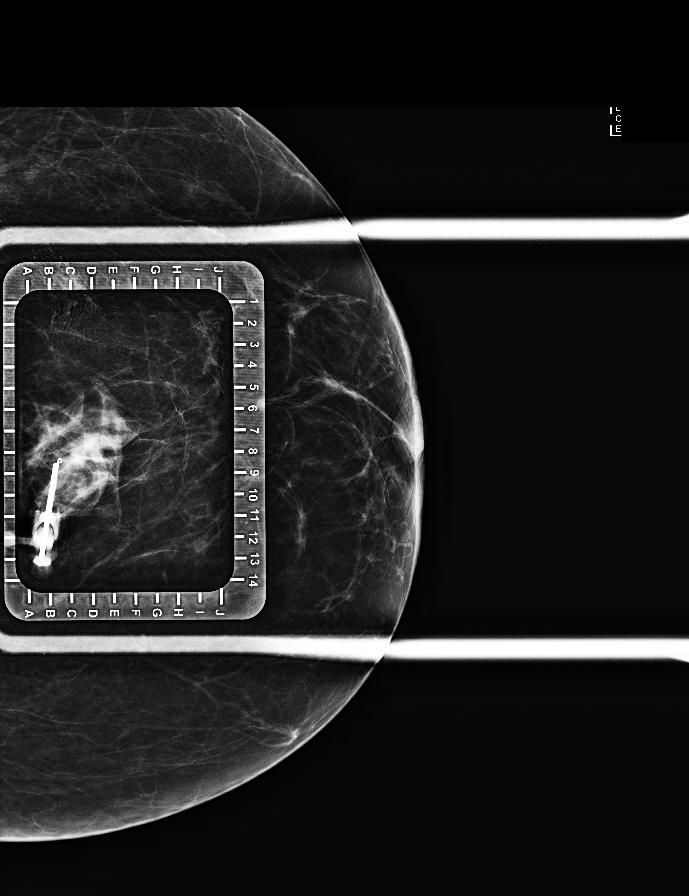

[L CC (4 of 6)]
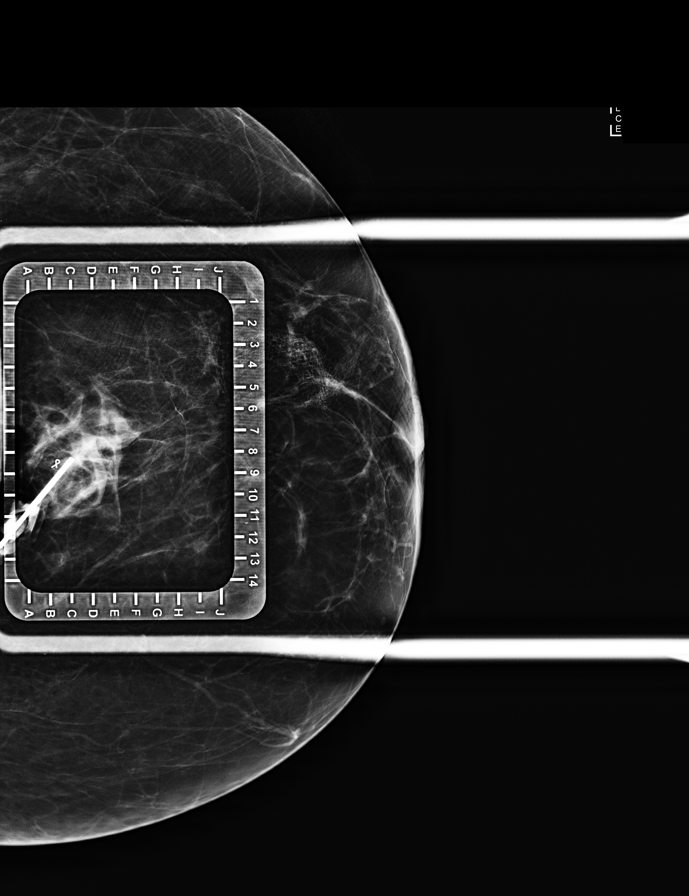

[L LM (2 of 2)]
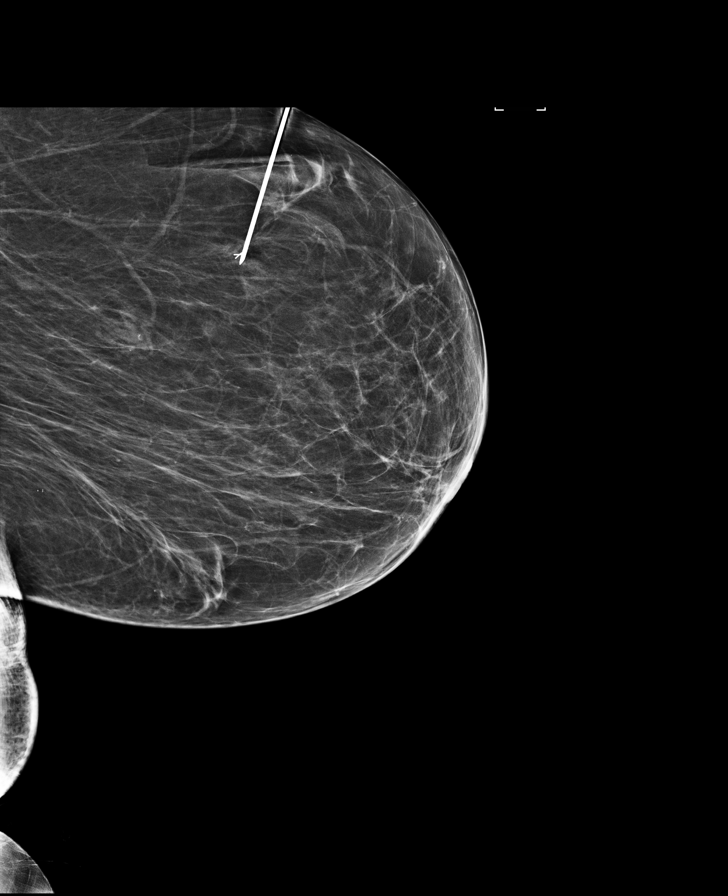

[L CC (5 of 6)]
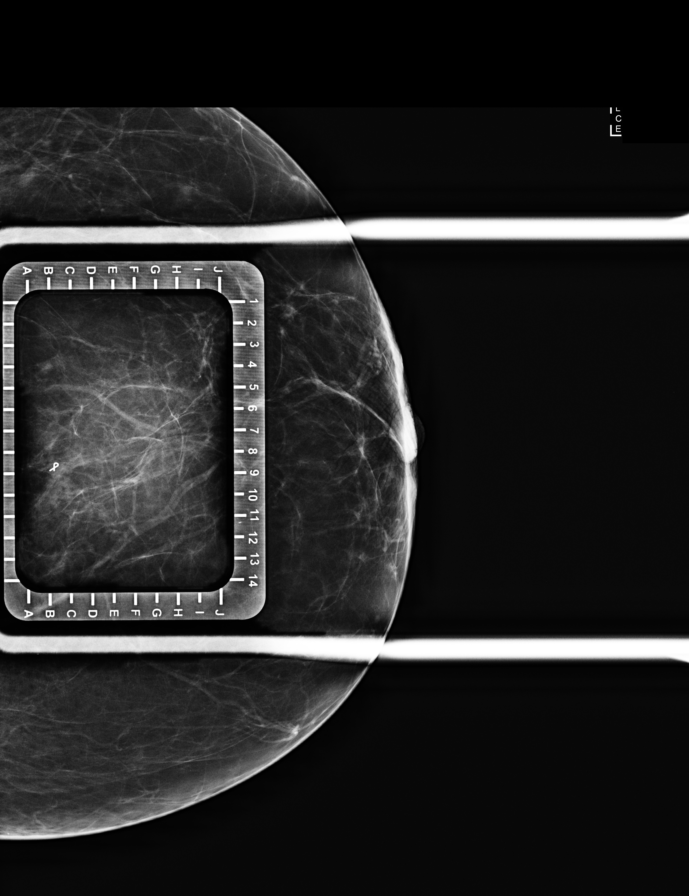

[L CC (6 of 6)]
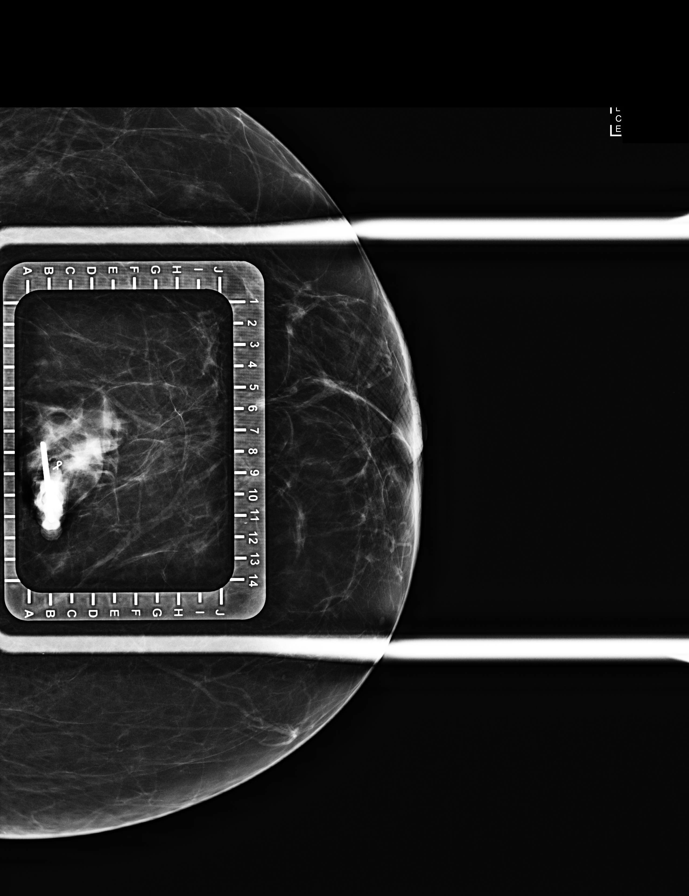

[8 of 10 positions shown; findings below may reference images not displayed]

FINDINGS: Patient presents for radioactive seed localization prior to left
lumpectomy. I met with the patient and we discussed the procedure of
seed localization including benefits and alternatives. We discussed
the high likelihood of a successful procedure. We discussed the
risks of the procedure including infection, bleeding, tissue injury
and further surgery. We discussed the low dose of radioactivity
involved in the procedure. Informed, written consent was given.

The usual time-out protocol was performed immediately prior to the
procedure.

Using mammographic guidance, sterile technique, 1% lidocaine and an
U-QRE radioactive seed, the recently placed ribbon shaped biopsy
marker clip in the 11:30 o'clock position of the left breast was
localized using a cephalad approach. The follow-up mammogram images
confirm the seed in the expected location and were marked for Dr.
Lorraine.

Follow-up survey of the patient confirms presence of the radioactive
seed.

Order number of U-QRE seed:  [id number].

Total activity:  0.254 mCi reference Date: 08/14/2019

The patient tolerated the procedure well and was released from the
[REDACTED]. She was given instructions regarding seed removal.
IMPRESSION: Radioactive seed localization left breast. No apparent
complications.

## 2022-06-24 ENCOUNTER — Other Ambulatory Visit: Payer: Self-pay | Admitting: Hematology and Oncology

## 2022-06-24 DIAGNOSIS — Z9889 Other specified postprocedural states: Secondary | ICD-10-CM

## 2022-08-11 ENCOUNTER — Ambulatory Visit
Admission: RE | Admit: 2022-08-11 | Discharge: 2022-08-11 | Disposition: A | Discharge status: Home / Self Care | Payer: Medicare Other | Source: Ambulatory Visit | Attending: Hematology and Oncology | Admitting: Hematology and Oncology

## 2022-08-11 DIAGNOSIS — Z9889 Other specified postprocedural states: Secondary | ICD-10-CM

## 2022-08-27 ENCOUNTER — Other Ambulatory Visit: Payer: Self-pay

## 2022-08-27 DIAGNOSIS — C50212 Malignant neoplasm of upper-inner quadrant of left female breast: Secondary | ICD-10-CM

## 2022-08-31 ENCOUNTER — Inpatient Hospital Stay: Payer: Medicare Other | Attending: Internal Medicine

## 2022-08-31 ENCOUNTER — Inpatient Hospital Stay: Payer: Medicare Other | Admitting: Adult Health

## 2022-09-10 ENCOUNTER — Telehealth: Payer: Self-pay

## 2022-09-10 NOTE — Telephone Encounter (Signed)
Pt called stating she need to r/s her appt she missed 08/31/22. Pt verbalizes she does not have any concerns at the moment. Advised pt scheduling will be in touch with her to r/s her missed appt. She verbalized thanks and understanding.  Message sent to scheduler.

## 2022-09-13 ENCOUNTER — Telehealth: Payer: Self-pay | Admitting: Adult Health

## 2022-09-13 NOTE — Telephone Encounter (Signed)
Scheduled appointments per staff message. Patient is aware of the made appointments. 

## 2022-10-01 ENCOUNTER — Inpatient Hospital Stay: Payer: Medicare Other | Attending: Internal Medicine

## 2022-10-01 ENCOUNTER — Encounter: Payer: Self-pay | Admitting: Adult Health

## 2022-10-01 ENCOUNTER — Other Ambulatory Visit: Payer: Self-pay

## 2022-10-01 ENCOUNTER — Inpatient Hospital Stay: Payer: Medicare Other | Admitting: Adult Health

## 2022-10-01 VITALS — BP 130/67 | HR 64 | Temp 97.5°F | Resp 18 | Ht 60.0 in | Wt 121.5 lb

## 2022-10-01 DIAGNOSIS — Z170 Estrogen receptor positive status [ER+]: Secondary | ICD-10-CM | POA: Insufficient documentation

## 2022-10-01 DIAGNOSIS — F17210 Nicotine dependence, cigarettes, uncomplicated: Secondary | ICD-10-CM | POA: Diagnosis not present

## 2022-10-01 DIAGNOSIS — C50212 Malignant neoplasm of upper-inner quadrant of left female breast: Secondary | ICD-10-CM

## 2022-10-01 DIAGNOSIS — Z923 Personal history of irradiation: Secondary | ICD-10-CM | POA: Diagnosis not present

## 2022-10-01 DIAGNOSIS — Z79811 Long term (current) use of aromatase inhibitors: Secondary | ICD-10-CM | POA: Diagnosis not present

## 2022-10-01 DIAGNOSIS — M810 Age-related osteoporosis without current pathological fracture: Secondary | ICD-10-CM | POA: Diagnosis not present

## 2022-10-01 LAB — CBC WITH DIFFERENTIAL/PLATELET
Abs Immature Granulocytes: 0.04 10*3/uL (ref 0.00–0.07)
Basophils Absolute: 0 10*3/uL (ref 0.0–0.1)
Basophils Relative: 0 %
Eosinophils Absolute: 0.2 10*3/uL (ref 0.0–0.5)
Eosinophils Relative: 2 %
HCT: 44.7 % (ref 36.0–46.0)
Hemoglobin: 15.2 g/dL — ABNORMAL HIGH (ref 12.0–15.0)
Immature Granulocytes: 0 %
Lymphocytes Relative: 27 %
Lymphs Abs: 3 10*3/uL (ref 0.7–4.0)
MCH: 31.3 pg (ref 26.0–34.0)
MCHC: 34 g/dL (ref 30.0–36.0)
MCV: 92 fL (ref 80.0–100.0)
Monocytes Absolute: 0.8 10*3/uL (ref 0.1–1.0)
Monocytes Relative: 7 %
Neutro Abs: 7.1 10*3/uL (ref 1.7–7.7)
Neutrophils Relative %: 64 %
Platelets: 251 10*3/uL (ref 150–400)
RBC: 4.86 MIL/uL (ref 3.87–5.11)
RDW: 13.3 % (ref 11.5–15.5)
WBC: 11.2 10*3/uL — ABNORMAL HIGH (ref 4.0–10.5)
nRBC: 0 % (ref 0.0–0.2)

## 2022-10-01 LAB — COMPREHENSIVE METABOLIC PANEL
ALT: 18 U/L (ref 0–44)
AST: 19 U/L (ref 15–41)
Albumin: 3.8 g/dL (ref 3.5–5.0)
Alkaline Phosphatase: 88 U/L (ref 38–126)
Anion gap: 5 (ref 5–15)
BUN: 17 mg/dL (ref 8–23)
CO2: 37 mmol/L — ABNORMAL HIGH (ref 22–32)
Calcium: 10 mg/dL (ref 8.9–10.3)
Chloride: 98 mmol/L (ref 98–111)
Creatinine, Ser: 0.74 mg/dL (ref 0.44–1.00)
GFR, Estimated: >60 mL/min (ref >60)
Glucose, Bld: 95 mg/dL (ref 70–99)
Potassium: 3.6 mmol/L (ref 3.5–5.1)
Sodium: 140 mmol/L (ref 135–145)
Total Bilirubin: 0.6 mg/dL (ref 0.3–1.2)
Total Protein: 7 g/dL (ref 6.5–8.1)

## 2022-10-01 NOTE — Assessment & Plan Note (Addendum)
Debra Keller is an 82 year old woman with history of right breast cancer here today for follow-up and evaluation of more recently diagnosed left breast cancer in May 2021 status postlumpectomy, adjuvant radiation, and anastrozole that began in June 2021.  Left breast cancer, history of right breast cancer: She has no clinical or radiographic signs of breast cancer recurrence.  I recommended that she continue to undergo annual screening mammograms next due in May 2024.  I also recommended that she continue with anastrozole daily which she tolerates well. Osteoporosis: Her most recent bone density testing occurred in November 2023.  She continues on Fosamax with good tolerance.  Should her bone density be decreased when it is due for retesting in November 2025 we will discuss whether to change her osteoporosis treatment to Zometa or Prolia. Health maintenance: I recommended continued healthy diet and activity.  She knows to call for any questions or concerns that may arise between now and her next appointment.  RTC in 6 months for follow up

## 2022-10-01 NOTE — Progress Notes (Signed)
[facility] Cancer Follow up:    Charlane Ferretti, DO [address] [place] [address]   DIAGNOSIS:  Cancer Staging  Malignant neoplasm of upper-inner quadrant of left breast in female, estrogen receptor positive (HCC) Staging form: Breast, AJCC 8th Edition - Clinical stage from 07/25/2019: Stage IA (cT1a, cN0, cM0, G2, ER+, PR+, HER2-) - Unsigned Stage prefix: Initial diagnosis Histologic grading system: 3 grade system Laterality: Left Staged by: Pathologist and managing physician Stage used in treatment planning: Yes National guidelines used in treatment planning: Yes Type of national guideline used in treatment planning: NCCN   SUMMARY OF ONCOLOGIC HISTORY: [name] woman   (1) status post right lumpectomy and sentinel lymph node biopsy July of 2008 for a T1b N0, estrogen receptor positive, progesterone receptor and HER-2 negative invasive lobular breast cancer (stage IA), grade 1, status post radiation, on tamoxifen from November of 2008 through December of 2013 with excellent tolerance.   (2) status post left breast upper inner quadrant biopsy 07/18/2019 for a clinical T1a NX, stage IA invasive ductal carcinoma, grade 2, estrogen and progesterone receptor positive, HER-2 not amplified, with an MIB-1 of 1%   (3) status post left lumpectomy and sentinel lymph node dissection 08/16/2019 for a pT1C pN0, stage  IA invasive ductal carcinoma, grade 2, with negative margins.   (4) adjuvant radiation not indicated (patient plans to take antiestrogens for 5 years)   (5) anastrozole started 09/19/2019             (a) bone density 09/30/2015 showed a T score of -2.2             (b) bone density 12/26/2019 shows a T score of -2.5             (c) alendronate started March 2022  CURRENT THERAPY: Anastrozole  INTERVAL HISTORY: Debra Keller 82 y.o. female returns for f/u of her history of breast cancer.  She continues on Anastrozole and Alendronate without  difficulty. Her most recent mammogram occurred on 08/11/2022 and showed no evidence of malignancy and breast density B.  Her bone density in 02/2022 demonstrated osteoporosis.  She continues on Fosamax and tolerates this well.     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  MEDICAL HISTORY: Past Medical History:  Diagnosis Date   AAA (abdominal aortic aneurysm) without rupture [facility])    vascular consult w/ dr Myra Gianotti 12-19-2017,  measures 2.8cm   Balance problem    uses cane   Bladder tumor    Breast cancer, stage 1, estrogen receptor positive, right [facility]) oncologist-  dr Darnelle Catalan   dx 07/ 2008----  Stage IA (T1b,N0)  Grade 1,  invasive lobular carcinoma--- s/p  right breast lumpectomy w/ sln dissections 10-13-2006  and completed radiation 10/ 2008,  completed tamoxifen 2016   Centrilobular emphysema (HCC)    per CT 12-07-2017   COPD (chronic obstructive pulmonary disease) (HCC)    Dyspnea    with exertion   Frequency of urination    Full dentures    Hematuria    History of external beam radiation therapy    completed 10/ 2008  right breast cancer   History of TIAs    12-29-2017 per pt never had symptoms, but pcp found per  MRI had 3 TIAs,  residual balance issues   Hyperlipidemia    Hypertension    Stroke [facility]) few yrs agi   left sided weakness and speech    Wears glasses     SURGICAL HISTORY: Past Surgical History:  Procedure Laterality Date   ABDOMINAL HYSTERECTOMY  1980s   W/ UNILATERAL SALPINGOOPHORECTOMY   APPENDECTOMY  many yrs ago   BREAST LUMPECTOMY Left 08/16/2019   BREAST LUMPECTOMY Right 2008   BREAST LUMPECTOMY WITH NEEDLE LOCALIZATION AND AXILLARY LYMPH NODE DISSECTION Right 10-13-2006   dr cornett  @[facility]     BREAST LUMPECTOMY WITH RADIOACTIVE SEED AND SENTINEL LYMPH NODE BIOPSY Left 08/16/2019   Procedure: BREAST LUMPECTOMY WITH RADIOACTIVE SEED AND SENTINEL LYMPH NODE BIOPSY;  Surgeon: Harriette Bouillon, MD;  Location: [facility];  Service: General;  Laterality: Left;  GEN PEC   SHOULDER OPEN ROTATOR CUFF REPAIR Left early 2000s   TONSILLECTOMY  child   TRANSURETHRAL RESECTION OF BLADDER TUMOR N/A 02/03/2018   Procedure: RESTAGE TRANSURETHRAL RESECTION OF BLADDER TUMOR (TURBT);  Surgeon: Crista Elliot, MD;  Location: [facility];  Service: Urology;  Laterality: N/A;   TRANSURETHRAL RESECTION OF BLADDER TUMOR WITH MITOMYCIN-C N/A 01/02/2018   Procedure: TRANSURETHRAL RESECTION OF BLADDER TUMOR WITH GEMCITABINE;  Surgeon: Crista Elliot, MD;  Location: [facility];  Service: Urology;  Laterality: N/A;    SOCIAL HISTORY: Social History   Socioeconomic History   Marital status: Married    Spouse name: Not on file   Number of children: Not on file   Years of education: Not on file   Highest education level: Not on file  Occupational History   Not on file  Tobacco Use   Smoking status: Every Day    Packs/day: 0.25    Years: 60.00    Additional pack years: 0.00    Total pack years: 15.00    Types: Cigarettes   Smokeless tobacco: Never   Tobacco comments:    12-29-2017 per pt 4-5 cig per day  Vaping Use   Vaping Use: Never used  Substance and Sexual Activity   Alcohol use: Yes    Comment: very rare   Drug use: Never   Sexual activity: Not on file  Other Topics Concern   Not on file  Social History Narrative   Not on file   Social Determinants of Health   Financial Resource Strain: Not on file  Food Insecurity: Not on file  Transportation Needs: Not on file  Physical Activity: Not on file  Stress: Not on file  Social Connections: Not on file  Intimate Partner Violence: Not on file    FAMILY HISTORY: History reviewed. No  pertinent family history.  Review of Systems  Constitutional:  Negative for appetite change, chills, fatigue, fever and unexpected weight change.  HENT:   Negative for hearing loss, lump/mass and trouble swallowing.   Eyes:  Negative for eye problems and icterus.  Respiratory:  Negative for chest tightness, cough and shortness of breath.   Cardiovascular:  Negative for chest pain, leg swelling and palpitations.  Gastrointestinal:  Negative for abdominal distention, abdominal pain, constipation, diarrhea, nausea and vomiting.  Endocrine: Negative for hot flashes.  Genitourinary:  Negative for difficulty urinating.   Musculoskeletal:  Negative for arthralgias.  Skin:  Negative for itching and rash.  Neurological:  Negative for dizziness, extremity weakness, headaches and numbness.  Hematological:  Negative for adenopathy. Does not bruise/bleed easily.  Psychiatric/Behavioral:  Negative  for depression. The patient is not nervous/anxious.       PHYSICAL EXAMINATION   Onc Performance Status - 10/01/22 1220       ECOG Perf Status   ECOG Perf Status Ambulatory and capable of all selfcare but unable to carry out any work activities.  Up and about more than 50% of waking hours      KPS SCALE   KPS % SCORE Normal activity with effort, some s/s of disease             Vitals:   10/01/22 1202  BP: 130/67  Pulse: 64  Resp: 18  Temp: (!) 97.5 F (36.4 C)  SpO2: 92%    Physical Exam Constitutional:      General: She is not in acute distress.    Appearance: Normal appearance. She is not toxic-appearing.  HENT:     Head: Normocephalic and atraumatic.     Mouth/Throat:     Mouth: Mucous membranes are moist.     Pharynx: Oropharynx is clear. No oropharyngeal exudate or posterior oropharyngeal erythema.  Eyes:     General: No scleral icterus. Cardiovascular:     Rate and Rhythm: Normal rate and regular rhythm.     Pulses: Normal pulses.     Heart sounds: Normal heart sounds.   Pulmonary:     Effort: Pulmonary effort is normal.     Breath sounds: Normal breath sounds.  Chest:     Comments: Right breast status postlumpectomy and radiation; left breast s/p lumpectomy and radiation, no sign of local recurrence bilaterally Abdominal:     General: Abdomen is flat. Bowel sounds are normal. There is no distension.     Palpations: Abdomen is soft.     Tenderness: There is no abdominal tenderness.  Musculoskeletal:        General: No swelling.     Cervical back: Neck supple.  Lymphadenopathy:     Cervical: No cervical adenopathy.  Skin:    General: Skin is warm and dry.     Findings: No rash.  Neurological:     General: No focal deficit present.     Mental Status: She is alert.  Psychiatric:        Mood and Affect: Mood normal.        Behavior: Behavior normal.     LABORATORY DATA:  CBC    Component Value Date/Time   WBC 11.2 (H) 10/01/2022 1147   RBC 4.86 10/01/2022 1147   HGB 15.2 (H) 10/01/2022 1147   HGB 14.9 08/27/2021 1239   HGB 14.1 02/28/2012 1352   HCT 44.7 10/01/2022 1147   HCT 41.5 02/28/2012 1352   PLT 251 10/01/2022 1147   PLT 216 08/27/2021 1239   PLT 308 02/28/2012 1352   MCV 92.0 10/01/2022 1147   MCV 92.5 02/28/2012 1352   MCH 31.3 10/01/2022 1147   MCHC 34.0 10/01/2022 1147   RDW 13.3 10/01/2022 1147   RDW 14.3 02/28/2012 1352   LYMPHSABS 3.0 10/01/2022 1147   LYMPHSABS 3.2 02/28/2012 1352   MONOABS 0.8 10/01/2022 1147   MONOABS 0.7 02/28/2012 1352   EOSABS 0.2 10/01/2022 1147   EOSABS 0.2 02/28/2012 1352   BASOSABS 0.0 10/01/2022 1147   BASOSABS 0.1 02/28/2012 1352    CMP     Component Value Date/Time   NA 140 10/01/2022 1147   NA 142 02/28/2012 1352   K 3.6 10/01/2022 1147   K 3.7 02/28/2012 1352   CL 98 10/01/2022 1147   CL  98 02/28/2012 1352   CO2 37 (H) 10/01/2022 1147   CO2 33 (H) 02/28/2012 1352   GLUCOSE 95 10/01/2022 1147   GLUCOSE 101 (H) 02/28/2012 1352   BUN 17 10/01/2022 1147   BUN 16.0  02/28/2012 1352   CREATININE 0.74 10/01/2022 1147   CREATININE 0.94 08/27/2021 1239   CREATININE 0.8 02/28/2012 1352   CALCIUM 10.0 10/01/2022 1147   CALCIUM 9.8 02/28/2012 1352   PROT 7.0 10/01/2022 1147   PROT 6.8 02/28/2012 1352   ALBUMIN 3.8 10/01/2022 1147   ALBUMIN 3.5 02/28/2012 1352   AST 19 10/01/2022 1147   AST 15 08/27/2021 1239   AST 14 02/28/2012 1352   ALT 18 10/01/2022 1147   ALT 22 08/27/2021 1239   ALT 14 02/28/2012 1352   ALKPHOS 88 10/01/2022 1147   ALKPHOS 75 02/28/2012 1352   BILITOT 0.6 10/01/2022 1147   BILITOT 0.6 08/27/2021 1239   BILITOT 0.34 02/28/2012 1352   GFRNONAA >60 10/01/2022 1147   GFRNONAA >60 08/27/2021 1239   GFRAA >60 12/25/2019 1252       ASSESSMENT and THERAPY PLAN:   Malignant neoplasm of upper-inner quadrant of left breast in female, estrogen receptor positive (HCC) Debra Keller is an 82 year old woman with history of right breast cancer here today for follow-up and evaluation of more recently diagnosed left breast cancer in May 2021 status postlumpectomy, adjuvant radiation, and anastrozole that began in June 2021.  Left breast cancer, history of right breast cancer: She has no clinical or radiographic signs of breast cancer recurrence.  I recommended that she continue to undergo annual screening mammograms next due in May 2024.  I also recommended that she continue with anastrozole daily which she tolerates well. Osteoporosis: Her most recent bone density testing occurred in November 2023.  She continues on Fosamax with good tolerance.  Should her bone density be decreased when it is due for retesting in November 2025 we will discuss whether to change her osteoporosis treatment to Zometa or Prolia. Health maintenance: I recommended continued healthy diet and activity.  She knows to call for any questions or concerns that may arise between now and her next appointment.  RTC in 6 months for follow up   All questions were answered. The  patient knows to call the clinic with any problems, questions or concerns. We can certainly see the patient much sooner if necessary.  Total encounter time:20 minutes*in face-to-face visit time, chart review, lab review, care coordination, order entry, and documentation of the encounter time.    Lillard Anes, NP 10/01/22 1:34 PM Medical Oncology and Hematology [facility] [address] [place], [place] [address] Tel. [phone]    Fax. [phone]  *Total Encounter Time as defined by the Centers for Medicare and Medicaid Services includes, in addition to the face-to-face time of a patient visit (documented in the note above) non-face-to-face time: obtaining and reviewing outside history, ordering and reviewing medications, tests or procedures, care coordination (communications with other health care professionals or caregivers) and documentation in the medical record.

## 2022-11-08 ENCOUNTER — Other Ambulatory Visit: Payer: Self-pay | Admitting: Adult Health

## 2023-04-01 ENCOUNTER — Other Ambulatory Visit: Payer: Self-pay | Admitting: *Deleted

## 2023-04-01 DIAGNOSIS — Z170 Estrogen receptor positive status [ER+]: Secondary | ICD-10-CM

## 2023-04-01 DIAGNOSIS — M810 Age-related osteoporosis without current pathological fracture: Secondary | ICD-10-CM

## 2023-04-04 ENCOUNTER — Inpatient Hospital Stay: Payer: Medicare Other | Attending: Hematology and Oncology

## 2023-04-04 ENCOUNTER — Inpatient Hospital Stay: Payer: Medicare Other | Admitting: Hematology and Oncology

## 2023-04-04 VITALS — BP 146/57 | HR 91 | Temp 97.2°F | Resp 16 | Wt 120.9 lb

## 2023-04-04 DIAGNOSIS — Z923 Personal history of irradiation: Secondary | ICD-10-CM | POA: Insufficient documentation

## 2023-04-04 DIAGNOSIS — M810 Age-related osteoporosis without current pathological fracture: Secondary | ICD-10-CM | POA: Diagnosis not present

## 2023-04-04 DIAGNOSIS — F17210 Nicotine dependence, cigarettes, uncomplicated: Secondary | ICD-10-CM | POA: Diagnosis not present

## 2023-04-04 DIAGNOSIS — Z79811 Long term (current) use of aromatase inhibitors: Secondary | ICD-10-CM | POA: Diagnosis not present

## 2023-04-04 DIAGNOSIS — Z853 Personal history of malignant neoplasm of breast: Secondary | ICD-10-CM | POA: Diagnosis not present

## 2023-04-04 DIAGNOSIS — C50212 Malignant neoplasm of upper-inner quadrant of left female breast: Secondary | ICD-10-CM | POA: Insufficient documentation

## 2023-04-04 DIAGNOSIS — Z170 Estrogen receptor positive status [ER+]: Secondary | ICD-10-CM | POA: Diagnosis not present

## 2023-04-04 DIAGNOSIS — Z8673 Personal history of transient ischemic attack (TIA), and cerebral infarction without residual deficits: Secondary | ICD-10-CM | POA: Diagnosis not present

## 2023-04-04 LAB — CMP (CANCER CENTER ONLY)
ALT: 12 U/L (ref 0–44)
AST: 15 U/L (ref 15–41)
Albumin: 4.3 g/dL (ref 3.5–5.0)
Alkaline Phosphatase: 67 U/L (ref 38–126)
Anion gap: 7 (ref 5–15)
BUN: 25 mg/dL — ABNORMAL HIGH (ref 8–23)
CO2: 37 mmol/L — ABNORMAL HIGH (ref 22–32)
Calcium: 10.4 mg/dL — ABNORMAL HIGH (ref 8.9–10.3)
Chloride: 100 mmol/L (ref 98–111)
Creatinine: 0.8 mg/dL (ref 0.44–1.00)
GFR, Estimated: >60 mL/min (ref >60)
Glucose, Bld: 112 mg/dL — ABNORMAL HIGH (ref 70–99)
Potassium: 3.5 mmol/L (ref 3.5–5.1)
Sodium: 144 mmol/L (ref 135–145)
Total Bilirubin: 0.6 mg/dL (ref 0.0–1.2)
Total Protein: 7.4 g/dL (ref 6.5–8.1)

## 2023-04-04 LAB — CBC WITH DIFFERENTIAL (CANCER CENTER ONLY)
Abs Immature Granulocytes: 0.04 10*3/uL (ref 0.00–0.07)
Basophils Absolute: 0 10*3/uL (ref 0.0–0.1)
Basophils Relative: 0 %
Eosinophils Absolute: 0.1 10*3/uL (ref 0.0–0.5)
Eosinophils Relative: 1 %
HCT: 45.2 % (ref 36.0–46.0)
Hemoglobin: 15 g/dL (ref 12.0–15.0)
Immature Granulocytes: 0 %
Lymphocytes Relative: 29 %
Lymphs Abs: 2.9 10*3/uL (ref 0.7–4.0)
MCH: 31.3 pg (ref 26.0–34.0)
MCHC: 33.2 g/dL (ref 30.0–36.0)
MCV: 94.2 fL (ref 80.0–100.0)
Monocytes Absolute: 0.7 10*3/uL (ref 0.1–1.0)
Monocytes Relative: 7 %
Neutro Abs: 6.1 10*3/uL (ref 1.7–7.7)
Neutrophils Relative %: 63 %
Platelet Count: 256 10*3/uL (ref 150–400)
RBC: 4.8 MIL/uL (ref 3.87–5.11)
RDW: 13.9 % (ref 11.5–15.5)
WBC Count: 9.9 10*3/uL (ref 4.0–10.5)
nRBC: 0 % (ref 0.0–0.2)

## 2023-04-04 NOTE — Progress Notes (Signed)
[facility] Cancer Follow up:    Debra Ferretti, DO [address] [place] [address]   DIAGNOSIS:  Cancer Staging  Malignant neoplasm of upper-inner quadrant of left breast in female, estrogen receptor positive (HCC) Staging form: Breast, AJCC 8th Edition - Clinical stage from 07/25/2019: Stage IA (cT1a, cN0, cM0, G2, ER+, PR+, HER2-) - Unsigned Stage prefix: Initial diagnosis Histologic grading system: 3 grade system Laterality: Left Staged by: Pathologist and managing physician Stage used in treatment planning: Yes National guidelines used in treatment planning: Yes Type of national guideline used in treatment planning: NCCN   SUMMARY OF ONCOLOGIC HISTORY: [place] woman   (1) status post right lumpectomy and sentinel lymph node biopsy July of 2008 for a T1b N0, estrogen receptor positive, progesterone receptor and HER-2 negative invasive lobular breast cancer (stage IA), grade 1, status post radiation, on tamoxifen from November of 2008 through December of 2013 with excellent tolerance.   (2) status post left breast upper inner quadrant biopsy 07/18/2019 for a clinical T1a NX, stage IA invasive ductal carcinoma, grade 2, estrogen and progesterone receptor positive, HER-2 not amplified, with an MIB-1 of 1%   (3) status post left lumpectomy and sentinel lymph node dissection 08/16/2019 for a pT1C pN0, stage  IA invasive ductal carcinoma, grade 2, with negative margins.   (4) adjuvant radiation not indicated (patient plans to take antiestrogens for 5 years)   (5) anastrozole started 09/19/2019             (a) bone density 09/30/2015 showed a T score of -2.2             (b) bone density 12/26/2019 shows a T score of -2.5             (c) alendronate started March 2022  CURRENT THERAPY: Anastrozole  INTERVAL HISTORY:  Debra Keller 82 y.o. female returns for f/u of her history of breast cancer.  She continues on Anastrozole and Alendronate without  difficulty. Her most recent mammogram occurred on 08/11/2022 and showed no evidence of malignancy and breast density B.  Her bone density in 02/2022 demonstrated osteoporosis.  She continues on Fosamax and tolerates this well.    Discussed the use of AI scribe software for clinical note transcription with the patient, who gave verbal consent to proceed.  History of Present Illness    The patient, with a history of osteoporosis and breast cancer, presents for a routine follow-up. She reports tolerating her current medication regimen well, with only occasional hot flashes. She is currently on Fosamax for osteoporosis. The patient's mammogram in May was unremarkable. The patient also has a history of mini strokes, which have resulted in balance issues. She uses a cane for balance and mobility.   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  MEDICAL HISTORY: Past Medical History:  Diagnosis Date   AAA (abdominal aortic aneurysm) without rupture [facility])    vascular consult w/ dr Myra Gianotti 12-19-2017,  measures 2.8cm   Balance problem    uses cane   Bladder tumor    Breast cancer, stage 1, estrogen receptor positive, right [facility]) oncologist-  dr Darnelle Catalan   dx 07/ 2008----  Stage IA (T1b,N0)  Grade 1,  invasive lobular carcinoma--- s/p  right  breast lumpectomy w/ sln dissections 10-13-2006  and completed radiation 10/ 2008,  completed tamoxifen 2016   Centrilobular emphysema (HCC)    per CT 12-07-2017   COPD (chronic obstructive pulmonary disease) (HCC)    Dyspnea    with exertion   Frequency of urination    Full dentures    Hematuria    History of external beam radiation therapy    completed 10/ 2008  right breast cancer   History of TIAs     12-29-2017 per pt never had symptoms, but pcp found per MRI had 3 TIAs,  residual balance issues   Hyperlipidemia    Hypertension    Stroke [facility]) few yrs agi   left sided weakness and speech    Wears glasses     SURGICAL HISTORY: Past Surgical History:  Procedure Laterality Date   ABDOMINAL HYSTERECTOMY  1980s   W/ UNILATERAL SALPINGOOPHORECTOMY   APPENDECTOMY  many yrs ago   BREAST LUMPECTOMY Left 08/16/2019   BREAST LUMPECTOMY Right 2008   BREAST LUMPECTOMY WITH NEEDLE LOCALIZATION AND AXILLARY LYMPH NODE DISSECTION Right 10-13-2006   dr cornett  @[facility]    BREAST LUMPECTOMY WITH RADIOACTIVE SEED AND SENTINEL LYMPH NODE BIOPSY Left 08/16/2019   Procedure: BREAST LUMPECTOMY WITH RADIOACTIVE SEED AND SENTINEL LYMPH NODE BIOPSY;  Surgeon: Harriette Bouillon, MD;  Location: [facility];  Service: General;  Laterality: Left;  GEN PEC   SHOULDER OPEN ROTATOR CUFF REPAIR Left early 2000s   TONSILLECTOMY  child   TRANSURETHRAL RESECTION OF BLADDER TUMOR N/A 02/03/2018   Procedure: RESTAGE TRANSURETHRAL RESECTION OF BLADDER TUMOR (TURBT);  Surgeon: Crista Elliot, MD;  Location: [facility];  Service: Urology;  Laterality: N/A;   TRANSURETHRAL RESECTION OF BLADDER TUMOR WITH MITOMYCIN-C N/A 01/02/2018   Procedure: TRANSURETHRAL RESECTION OF BLADDER TUMOR WITH GEMCITABINE;  Surgeon: Crista Elliot, MD;  Location: [facility];  Service: Urology;  Laterality: N/A;    SOCIAL HISTORY: Social History   Socioeconomic History   Marital status: Married    Spouse name: Not on file   Number of children: Not on file   Years of education: Not on file   Highest education level: Not on file  Occupational History   Not on file  Tobacco Use   Smoking status: Every Day    Current packs/day: 0.25    Average packs/day: 0.3 packs/day for 60.0 years (15.0 ttl pk-yrs)    Types: Cigarettes   Smokeless tobacco: Never   Tobacco comments:    12-29-2017 per pt  4-5 cig per day  Vaping Use   Vaping status: Never Used  Substance and Sexual Activity   Alcohol use: Yes    Comment: very rare   Drug use: Never   Sexual activity: Not on file  Other Topics Concern   Not on file  Social History Narrative   Not on file   Social Drivers of Health   Financial Resource Strain: Not on file  Food Insecurity: Not on file  Transportation Needs: Not on file  Physical Activity: Not on file  Stress: Not on file  Social Connections: Not on file  Intimate Partner Violence: Not on file    FAMILY HISTORY: No family history on file.  Review of Systems  Constitutional:  Negative for appetite change, chills, fatigue, fever and unexpected weight change.  HENT:   Negative for hearing loss, lump/mass and trouble swallowing.   Eyes:  Negative for eye problems and icterus.  Respiratory:  Negative for chest tightness, cough and shortness of breath.   Cardiovascular:  Negative for chest pain, leg swelling and palpitations.  Gastrointestinal:  Negative for abdominal distention, abdominal pain, constipation, diarrhea, nausea and vomiting.  Endocrine: Negative for hot flashes.  Genitourinary:  Negative for difficulty urinating.   Musculoskeletal:  Negative for arthralgias.  Skin:  Negative for itching and rash.  Neurological:  Negative for dizziness, extremity weakness, headaches and numbness.  Hematological:  Negative for adenopathy. Does not bruise/bleed easily.  Psychiatric/Behavioral:  Negative for depression. The patient is not nervous/anxious.       PHYSICAL EXAMINATION   Vitals:   04/04/23 1128  BP: (!) 146/57  Pulse: 91  Resp: 16  Temp: (!) 97.2 F (36.2 C)  SpO2: 92%    Physical Exam Constitutional:      General: She is not in acute distress.    Appearance: Normal appearance. She is not toxic-appearing.  HENT:     Head: Normocephalic and atraumatic.     Mouth/Throat:     Mouth: Mucous membranes are moist.     Pharynx: Oropharynx is  clear. No oropharyngeal exudate or posterior oropharyngeal erythema.  Eyes:     General: No scleral icterus. Cardiovascular:     Rate and Rhythm: Normal rate and regular rhythm.     Pulses: Normal pulses.     Heart sounds: Normal heart sounds.  Pulmonary:     Effort: Pulmonary effort is normal.     Breath sounds: Normal breath sounds.  Chest:     Comments: Right breast status postlumpectomy and radiation; left breast s/p lumpectomy and radiation, no sign of local recurrence bilaterally Abdominal:     General: Abdomen is flat. Bowel sounds are normal. There is no distension.     Palpations: Abdomen is soft.     Tenderness: There is no abdominal tenderness.  Musculoskeletal:        General: No swelling.     Cervical back: Neck supple.  Lymphadenopathy:     Cervical: No cervical adenopathy.  Skin:    General: Skin is warm and dry.     Findings: No rash.  Neurological:     General: No focal deficit present.     Mental Status: She is alert.  Psychiatric:        Mood and Affect: Mood normal.        Behavior: Behavior normal.     LABORATORY DATA:  CBC    Component Value Date/Time   WBC 9.9 04/04/2023 1103   WBC 11.2 (H) 10/01/2022 1147   RBC 4.80 04/04/2023 1103   HGB 15.0 04/04/2023 1103   HGB 14.1 02/28/2012 1352   HCT 45.2 04/04/2023 1103   HCT 41.5 02/28/2012 1352   PLT 256 04/04/2023 1103   PLT 308 02/28/2012 1352   MCV 94.2 04/04/2023 1103   MCV 92.5 02/28/2012 1352   MCH 31.3 04/04/2023 1103   MCHC 33.2 04/04/2023 1103   RDW 13.9 04/04/2023 1103   RDW 14.3 02/28/2012 1352   LYMPHSABS 2.9 04/04/2023 1103   LYMPHSABS 3.2 02/28/2012 1352   MONOABS 0.7 04/04/2023 1103   MONOABS 0.7 02/28/2012 1352   EOSABS 0.1 04/04/2023 1103   EOSABS 0.2 02/28/2012 1352   BASOSABS 0.0 04/04/2023 1103   BASOSABS 0.1 02/28/2012 1352    CMP     Component Value Date/Time   NA 144 04/04/2023 1103   NA 142 02/28/2012 1352   K 3.5 04/04/2023 1103   K 3.7 02/28/2012 1352  CL 100 04/04/2023 1103   CL 98 02/28/2012 1352   CO2 37 (H) 04/04/2023 1103   CO2 33 (H) 02/28/2012 1352   GLUCOSE 112 (H) 04/04/2023 1103   GLUCOSE 101 (H) 02/28/2012 1352   BUN 25 (H) 04/04/2023 1103   BUN 16.0 02/28/2012 1352   CREATININE 0.80 04/04/2023 1103   CREATININE 0.8 02/28/2012 1352   CALCIUM 10.4 (H) 04/04/2023 1103   CALCIUM 9.8 02/28/2012 1352   PROT 7.4 04/04/2023 1103   PROT 6.8 02/28/2012 1352   ALBUMIN 4.3 04/04/2023 1103   ALBUMIN 3.5 02/28/2012 1352   AST 15 04/04/2023 1103   AST 14 02/28/2012 1352   ALT 12 04/04/2023 1103   ALT 14 02/28/2012 1352   ALKPHOS 67 04/04/2023 1103   ALKPHOS 75 02/28/2012 1352   BILITOT 0.6 04/04/2023 1103   BILITOT 0.34 02/28/2012 1352   GFRNONAA >60 04/04/2023 1103   GFRAA >60 12/25/2019 1252       ASSESSMENT and THERAPY PLAN:   Malignant neoplasm of upper-inner quadrant of left breast in female, estrogen receptor positive (HCC) Debra Keller is an 82 year old woman with history of right breast cancer here today for follow-up and evaluation of more recently diagnosed left breast cancer in May 2021 status postlumpectomy, adjuvant radiation, and anastrozole that began in June 2021.  Left breast cancer, history of right breast cancer: She has no clinical or radiographic signs of breast cancer recurrence.  I recommended that she continue to undergo annual screening mammograms next due in May 2025.  I also recommended that she continue with anastrozole daily which she tolerates well. Osteoporosis: Her most recent bone density testing occurred in November 2023.  She continues on Fosamax with good tolerance.  Repeat bone density in Nov 2025. Health maintenance: I recommended continued healthy diet and activity.  She knows to call for any questions or concerns that may arise between now and her next appointment. Balance issues, History of mini-strokes leading to balance issues. Using a cane for support. Continue using cane for balance.  Encourage regular, safe physical activity to maintain bone health.  RTC in  1 yr.    All questions were answered. The patient knows to call the clinic with any problems, questions or concerns. We can certainly see the patient much sooner if necessary.  Total encounter time:20 minutes*in face-to-face visit time, chart review, lab review, care coordination, order entry, and documentation of the encounter time.    Lillard Anes, NP 04/04/23 5:35 PM Medical Oncology and Hematology [facility] [address] [place], [place] [address] Tel. [phone]    Fax. [phone]  *Total Encounter Time as defined by the Centers for Medicare and Medicaid Services includes, in addition to the face-to-face time of a patient visit (documented in the note above) non-face-to-face time: obtaining and reviewing outside history, ordering and reviewing medications, tests or procedures, care coordination (communications with other health care professionals or caregivers) and documentation in the medical record.

## 2023-04-04 NOTE — Assessment & Plan Note (Addendum)
Debra Keller is an 82 year old woman with history of right breast cancer here today for follow-up and evaluation of more recently diagnosed left breast cancer in May 2021 status postlumpectomy, adjuvant radiation, and anastrozole that began in June 2021.  Left breast cancer, history of right breast cancer: She has no clinical or radiographic signs of breast cancer recurrence.  I recommended that she continue to undergo annual screening mammograms next due in May 2025.  I also recommended that she continue with anastrozole daily which she tolerates well. Osteoporosis: Her most recent bone density testing occurred in November 2023.  She continues on Fosamax with good tolerance.  Repeat bone density in Nov 2025. Health maintenance: I recommended continued healthy diet and activity.  She knows to call for any questions or concerns that may arise between now and her next appointment. Balance issues, History of mini-strokes leading to balance issues. Using a cane for support. Continue using cane for balance. Encourage regular, safe physical activity to maintain bone health.  RTC in  1 yr.

## 2023-08-12 ENCOUNTER — Ambulatory Visit
Admission: RE | Admit: 2023-08-12 | Discharge: 2023-08-12 | Disposition: A | Discharge status: Home / Self Care | Payer: Medicare Other | Source: Ambulatory Visit | Attending: Hematology and Oncology

## 2023-08-12 DIAGNOSIS — C50212 Malignant neoplasm of upper-inner quadrant of left female breast: Secondary | ICD-10-CM

## 2023-10-23 ENCOUNTER — Other Ambulatory Visit: Payer: Self-pay | Admitting: Adult Health

## 2023-10-24 NOTE — Telephone Encounter (Signed)
 Per last office note instructed to continue anastrozole .

## 2023-12-15 ENCOUNTER — Other Ambulatory Visit (HOSPITAL_COMMUNITY): Payer: Self-pay | Admitting: Radiology

## 2023-12-15 DIAGNOSIS — J439 Emphysema, unspecified: Secondary | ICD-10-CM

## 2023-12-21 ENCOUNTER — Ambulatory Visit (HOSPITAL_COMMUNITY)
Admission: RE | Admit: 2023-12-21 | Discharge: 2023-12-21 | Disposition: A | Discharge status: Home / Self Care | Source: Ambulatory Visit | Attending: Internal Medicine | Admitting: Internal Medicine

## 2023-12-21 DIAGNOSIS — J439 Emphysema, unspecified: Secondary | ICD-10-CM | POA: Diagnosis present

## 2023-12-21 LAB — PULMONARY FUNCTION TEST
DL/VA % pred: 76 %
DL/VA: 3.19 ml/min/mmHg/L
DLCO unc % pred: 63 %
DLCO unc: 10.1 ml/min/mmHg
FEF 25-75 Post: 0.67 L/s
FEF 25-75 Pre: 0.6 L/s
FEF2575-%Change-Post: 11 %
FEF2575-%Pred-Post: 65 %
FEF2575-%Pred-Pre: 58 %
FEV1-%Change-Post: 1 %
FEV1-%Pred-Post: 69 %
FEV1-%Pred-Pre: 68 %
FEV1-Post: 1.01 L
FEV1-Pre: 0.99 L
FEV1FVC-%Change-Post: -8 %
FEV1FVC-%Pred-Pre: 89 %
FEV6-%Change-Post: 10 %
FEV6-%Pred-Post: 90 %
FEV6-%Pred-Pre: 81 %
FEV6-Post: 1.67 L
FEV6-Pre: 1.51 L
FEV6FVC-%Pred-Post: 107 %
FEV6FVC-%Pred-Pre: 107 %
FVC-%Change-Post: 10 %
FVC-%Pred-Post: 84 %
FVC-%Pred-Pre: 76 %
FVC-Post: 1.67 L
FVC-Pre: 1.51 L
Post FEV1/FVC ratio: 61 %
Post FEV6/FVC ratio: 100 %
Pre FEV1/FVC ratio: 66 %
Pre FEV6/FVC Ratio: 100 %
RV % pred: 128 %
RV: 2.88 L
TLC % pred: 102 %
TLC: 4.54 L

## 2023-12-21 MED ORDER — ALBUTEROL SULFATE (2.5 MG/3ML) 0.083% IN NEBU
2.5000 mg | INHALATION_SOLUTION | Freq: Once | RESPIRATORY_TRACT | Status: AC
  Administered 2023-12-21: 2.5 mg via RESPIRATORY_TRACT

## 2024-03-17 ENCOUNTER — Emergency Department (HOSPITAL_COMMUNITY)

## 2024-03-17 ENCOUNTER — Encounter (HOSPITAL_COMMUNITY): Payer: Self-pay

## 2024-03-17 ENCOUNTER — Inpatient Hospital Stay (HOSPITAL_COMMUNITY)
Admission: EM | Admit: 2024-03-17 | Discharge: 2024-03-20 | DRG: 493 | Disposition: A | Attending: Family Medicine | Admitting: Family Medicine

## 2024-03-17 ENCOUNTER — Other Ambulatory Visit: Payer: Self-pay

## 2024-03-17 DIAGNOSIS — S82454A Nondisplaced comminuted fracture of shaft of right fibula, initial encounter for closed fracture: Secondary | ICD-10-CM | POA: Insufficient documentation

## 2024-03-17 DIAGNOSIS — J432 Centrilobular emphysema: Secondary | ICD-10-CM | POA: Diagnosis present

## 2024-03-17 DIAGNOSIS — Z789 Other specified health status: Secondary | ICD-10-CM

## 2024-03-17 DIAGNOSIS — Z8551 Personal history of malignant neoplasm of bladder: Secondary | ICD-10-CM | POA: Diagnosis not present

## 2024-03-17 DIAGNOSIS — M25571 Pain in right ankle and joints of right foot: Secondary | ICD-10-CM | POA: Diagnosis present

## 2024-03-17 DIAGNOSIS — F17210 Nicotine dependence, cigarettes, uncomplicated: Secondary | ICD-10-CM | POA: Diagnosis present

## 2024-03-17 DIAGNOSIS — D62 Acute posthemorrhagic anemia: Secondary | ICD-10-CM | POA: Diagnosis not present

## 2024-03-17 DIAGNOSIS — S82891A Other fracture of right lower leg, initial encounter for closed fracture: Secondary | ICD-10-CM | POA: Diagnosis not present

## 2024-03-17 DIAGNOSIS — Z885 Allergy status to narcotic agent status: Secondary | ICD-10-CM | POA: Diagnosis not present

## 2024-03-17 DIAGNOSIS — S82841A Displaced bimalleolar fracture of right lower leg, initial encounter for closed fracture: Secondary | ICD-10-CM | POA: Diagnosis present

## 2024-03-17 DIAGNOSIS — C50911 Malignant neoplasm of unspecified site of right female breast: Secondary | ICD-10-CM | POA: Diagnosis present

## 2024-03-17 DIAGNOSIS — Z923 Personal history of irradiation: Secondary | ICD-10-CM | POA: Diagnosis not present

## 2024-03-17 DIAGNOSIS — Z880 Allergy status to penicillin: Secondary | ICD-10-CM | POA: Diagnosis not present

## 2024-03-17 DIAGNOSIS — J449 Chronic obstructive pulmonary disease, unspecified: Secondary | ICD-10-CM | POA: Diagnosis not present

## 2024-03-17 DIAGNOSIS — E876 Hypokalemia: Secondary | ICD-10-CM | POA: Insufficient documentation

## 2024-03-17 DIAGNOSIS — M109 Gout, unspecified: Secondary | ICD-10-CM | POA: Diagnosis present

## 2024-03-17 DIAGNOSIS — I10 Essential (primary) hypertension: Secondary | ICD-10-CM | POA: Diagnosis present

## 2024-03-17 DIAGNOSIS — M810 Age-related osteoporosis without current pathological fracture: Secondary | ICD-10-CM | POA: Diagnosis present

## 2024-03-17 DIAGNOSIS — M80071A Age-related osteoporosis with current pathological fracture, right ankle and foot, initial encounter for fracture: Secondary | ICD-10-CM | POA: Diagnosis present

## 2024-03-17 DIAGNOSIS — I69354 Hemiplegia and hemiparesis following cerebral infarction affecting left non-dominant side: Secondary | ICD-10-CM | POA: Diagnosis not present

## 2024-03-17 DIAGNOSIS — Z79811 Long term (current) use of aromatase inhibitors: Secondary | ICD-10-CM | POA: Diagnosis not present

## 2024-03-17 DIAGNOSIS — Z7982 Long term (current) use of aspirin: Secondary | ICD-10-CM | POA: Diagnosis not present

## 2024-03-17 DIAGNOSIS — I714 Abdominal aortic aneurysm, without rupture, unspecified: Secondary | ICD-10-CM | POA: Diagnosis present

## 2024-03-17 DIAGNOSIS — R54 Age-related physical debility: Secondary | ICD-10-CM | POA: Diagnosis present

## 2024-03-17 DIAGNOSIS — Z7983 Long term (current) use of bisphosphonates: Secondary | ICD-10-CM | POA: Diagnosis not present

## 2024-03-17 DIAGNOSIS — Z7951 Long term (current) use of inhaled steroids: Secondary | ICD-10-CM | POA: Diagnosis not present

## 2024-03-17 DIAGNOSIS — E785 Hyperlipidemia, unspecified: Secondary | ICD-10-CM | POA: Diagnosis present

## 2024-03-17 DIAGNOSIS — Z79899 Other long term (current) drug therapy: Secondary | ICD-10-CM | POA: Diagnosis not present

## 2024-03-17 LAB — CBC
HCT: 43.4 % (ref 36.0–46.0)
Hemoglobin: 14.4 g/dL (ref 12.0–15.0)
MCH: 30.8 pg (ref 26.0–34.0)
MCHC: 33.2 g/dL (ref 30.0–36.0)
MCV: 92.9 fL (ref 80.0–100.0)
Platelets: 259 K/uL (ref 150–400)
RBC: 4.67 MIL/uL (ref 3.87–5.11)
RDW: 13.6 % (ref 11.5–15.5)
WBC: 13.8 K/uL — ABNORMAL HIGH (ref 4.0–10.5)
nRBC: 0 % (ref 0.0–0.2)

## 2024-03-17 LAB — SURGICAL PCR SCREEN
MRSA, PCR: NEGATIVE
Staphylococcus aureus: NEGATIVE

## 2024-03-17 LAB — BASIC METABOLIC PANEL WITH GFR
Anion gap: 14 (ref 5–15)
Anion gap: 9 (ref 5–15)
BUN: 15 mg/dL (ref 8–23)
BUN: 20 mg/dL (ref 8–23)
CO2: 32 mmol/L (ref 22–32)
CO2: 33 mmol/L — ABNORMAL HIGH (ref 22–32)
Calcium: 9.3 mg/dL (ref 8.9–10.3)
Calcium: 9.7 mg/dL (ref 8.9–10.3)
Chloride: 95 mmol/L — ABNORMAL LOW (ref 98–111)
Chloride: 100 mmol/L (ref 98–111)
Creatinine, Ser: 0.87 mg/dL (ref 0.44–1.00)
Creatinine, Ser: 0.92 mg/dL (ref 0.44–1.00)
GFR, Estimated: >60 mL/min (ref >60)
GFR, Estimated: >60 mL/min (ref >60)
Glucose, Bld: 124 mg/dL — ABNORMAL HIGH (ref 70–99)
Glucose, Bld: 131 mg/dL — ABNORMAL HIGH (ref 70–99)
Potassium: 2.8 mmol/L — ABNORMAL LOW (ref 3.5–5.1)
Potassium: 4.7 mmol/L (ref 3.5–5.1)
Sodium: 141 mmol/L (ref 135–145)
Sodium: 142 mmol/L (ref 135–145)

## 2024-03-17 LAB — MAGNESIUM: Magnesium: 1.9 mg/dL (ref 1.7–2.4)

## 2024-03-17 MED ORDER — ANASTROZOLE 1 MG PO TABS
1.0000 mg | ORAL_TABLET | Freq: Every day | ORAL | Status: DC
  Administered 2024-03-18 – 2024-03-20 (×3): 1 mg via ORAL
  Filled 2024-03-17 (×3): qty 1

## 2024-03-17 MED ORDER — SENNA 8.6 MG PO TABS
1.0000 | ORAL_TABLET | Freq: Every day | ORAL | Status: DC
  Administered 2024-03-17 – 2024-03-20 (×4): 8.6 mg via ORAL
  Filled 2024-03-17 (×4): qty 1

## 2024-03-17 MED ORDER — FENTANYL CITRATE (PF) 50 MCG/ML IJ SOSY
25.0000 ug | PREFILLED_SYRINGE | Freq: Once | INTRAMUSCULAR | Status: AC
  Administered 2024-03-17: 25 ug via INTRAVENOUS
  Filled 2024-03-17: qty 1

## 2024-03-17 MED ORDER — CEFAZOLIN SODIUM-DEXTROSE 2-4 GM/100ML-% IV SOLN: 2.0000 g | INTRAVENOUS | Status: DC

## 2024-03-17 MED ORDER — ENSURE PLUS HIGH PROTEIN PO LIQD
237.0000 mL | Freq: Two times a day (BID) | ORAL | Status: DC
  Administered 2024-03-18 – 2024-03-19 (×3): 237 mL via ORAL

## 2024-03-17 MED ORDER — ATORVASTATIN CALCIUM 40 MG PO TABS
40.0000 mg | ORAL_TABLET | Freq: Every day | ORAL | Status: DC
  Administered 2024-03-18 – 2024-03-20 (×3): 40 mg via ORAL
  Filled 2024-03-17 (×3): qty 1

## 2024-03-17 MED ORDER — POTASSIUM CHLORIDE CRYS ER 20 MEQ PO TBCR
40.0000 meq | EXTENDED_RELEASE_TABLET | Freq: Once | ORAL | Status: AC
  Administered 2024-03-17: 40 meq via ORAL
  Filled 2024-03-17: qty 2

## 2024-03-17 MED ORDER — METOPROLOL SUCCINATE ER 50 MG PO TB24: 100.0000 mg | ORAL_TABLET | Freq: Every morning | ORAL | Status: DC

## 2024-03-17 MED ORDER — TRANEXAMIC ACID-NACL 1000-0.7 MG/100ML-% IV SOLN
1000.0000 mg | INTRAVENOUS | Status: AC
  Administered 2024-03-18: 1000 mg via INTRAVENOUS

## 2024-03-17 MED ORDER — POLYETHYLENE GLYCOL 3350 17 G PO PACK
17.0000 g | PACK | Freq: Once | ORAL | Status: DC
  Filled 2024-03-17: qty 1

## 2024-03-17 MED ORDER — ONDANSETRON 4 MG PO TBDP: 4.0000 mg | ORAL_TABLET | Freq: Three times a day (TID) | ORAL | Status: DC | PRN

## 2024-03-17 MED ORDER — FLUTICASONE FUROATE-VILANTEROL 200-25 MCG/ACT IN AEPB
1.0000 | INHALATION_SPRAY | Freq: Every day | RESPIRATORY_TRACT | Status: DC
  Administered 2024-03-17 – 2024-03-20 (×3): 1 via RESPIRATORY_TRACT
  Filled 2024-03-17: qty 28

## 2024-03-17 MED ORDER — KETAMINE HCL 50 MG/5ML IJ SOSY
0.1500 mg/kg | PREFILLED_SYRINGE | Freq: Once | INTRAMUSCULAR | Status: AC
  Administered 2024-03-17: 8.2 mg via INTRAVENOUS
  Filled 2024-03-17: qty 5

## 2024-03-17 MED ORDER — ACETAMINOPHEN 500 MG PO TABS
1000.0000 mg | ORAL_TABLET | Freq: Four times a day (QID) | ORAL | Status: DC
  Administered 2024-03-17 (×2): 1000 mg via ORAL
  Filled 2024-03-17 (×2): qty 2

## 2024-03-17 MED ORDER — HYDROMORPHONE HCL 1 MG/ML IJ SOLN: 0.5000 mg | INTRAMUSCULAR | Status: DC | PRN

## 2024-03-17 MED ORDER — HEPARIN SODIUM (PORCINE) 5000 UNIT/ML IJ SOLN
5000.0000 [IU] | Freq: Three times a day (TID) | INTRAMUSCULAR | Status: AC
  Administered 2024-03-17 (×2): 5000 [IU] via SUBCUTANEOUS
  Filled 2024-03-17 (×2): qty 1

## 2024-03-17 MED ORDER — OXYCODONE HCL 5 MG PO TABS
5.0000 mg | ORAL_TABLET | ORAL | Status: DC | PRN
  Administered 2024-03-17: 5 mg via ORAL
  Filled 2024-03-17: qty 1

## 2024-03-17 NOTE — ED Notes (Signed)
 EDP, ortho tech, and this RN at bedside for R ankle fracture reduction and splinting.

## 2024-03-17 NOTE — Progress Notes (Signed)
 Orthopedic Tech Progress Note Patient Details:  Debra Keller [facility] 04/24/1940 [id number]  Well-padded posterior short leg splint with a stirrup applied to the RLE following the reduction by Dr. Neysa.  Ortho Devices Type of Ortho Device: Post (short leg) splint, Stirrup splint Ortho Device/Splint Location: RLE Ortho Device/Splint Interventions: Ordered, Application, Adjustment   Post Interventions Patient Tolerated: Well Instructions Provided: Care of device  [name] Ronal Brasil 03/17/2024, 12:12 PM

## 2024-03-17 NOTE — ED Provider Notes (Signed)
 [place] [facility] DE[facility]MENT AT [facility] Provider Note   CSN: [id number] Arrival date & time: 03/17/24  9177     Patient presents with: Fall and Ankle Pain   Debra Keller is a 83 y.o. female.   This is an 83 year old female presenting emergency department with right ankle pain.  Mechanical fall this morning.  Has been having some balance issues recently.  Ambulating with cane for long distances.  Loss of balance and tripped over foot.  She denies striking her head, no LOC.  No shortness of breath or abdominal pain.  No pain in pelvis.  She does note some left-sided chest wall pain, she notes this was from a few days ago from when she stumbled in the kitchen and fell backwards into countertop.  She did not fall to the ground at that time.   Fall  Ankle Pain      Prior to Admission medications  Medication Sig Start Date End Date Taking? Authorizing Provider  alendronate (FOSAMAX) 70 MG tablet Take 1 tablet (70 mg total) by mouth once a week. Take with a full glass of water on an empty stomach. 06/30/20  Yes Magrinat, Sandria BROCKS, MD  anastrozole  (ARIMIDEX ) 1 MG tablet TAKE 1 TABLET(1 MG) BY MOUTH DAILY 10/24/23  Yes Causey, Morna Pickle, NP  aspirin  EC 81 MG tablet Take 81 mg by mouth daily.   Yes [provider]  atorvastatin  (LIPITOR) 40 MG tablet Take 40 mg by mouth daily.   Yes [provider]  cholecalciferol  (VITAMIN D3) 25 MCG (1000 UNIT) tablet 1 tablet Orally Once a day for 30 day(s) 05/01/21  Yes [provider]  Fluticasone -Salmeterol (ADVAIR) 250-50 MCG/DOSE AEPB Inhale 2 puffs into the lungs once.    Yes [provider]  hydrochlorothiazide (HYDRODIURIL) 25 MG tablet Take 12.5 mg by mouth every morning. 12/15/11  Yes [provider]  metoprolol  succinate (TOPROL -XL) 100 MG 24 hr tablet Take 100 mg by mouth every morning.  12/16/15  Yes [provider]  vitamin B-12 (CYANOCOBALAMIN) 500 MCG  tablet Take 500 mcg by mouth daily.   Yes [provider]    Allergies: Penicillins and Morphine and codeine    Review of Systems  Updated Vital Signs BP 97/68   Pulse 63   Temp 98.1 F (36.7 C) (Oral)   Resp 18   Ht 5' (1.524 m)   Wt 54.4 kg   SpO2 95%   BMI 23.44 kg/m   Physical Exam Vitals and nursing note reviewed.  Constitutional:      General: She is not in acute distress.    Appearance: She is not toxic-appearing.  HENT:     Head: Normocephalic and atraumatic.     Nose: Nose normal.     Mouth/Throat:     Mouth: Mucous membranes are moist.  Eyes:     Conjunctiva/sclera: Conjunctivae normal.  Cardiovascular:     Rate and Rhythm: Normal rate and regular rhythm.     Pulses: Normal pulses.  Pulmonary:     Effort: Pulmonary effort is normal.     Breath sounds: Normal breath sounds.  Abdominal:     General: Abdomen is flat. There is no distension.     Palpations: Abdomen is soft.     Tenderness: There is no abdominal tenderness. There is no guarding or rebound.  Musculoskeletal:     Cervical back: No tenderness.     Comments: Chest wall stable some Einar tenderness to the left chest  wall.  Pelvis stable, nontender.  No bony tenderness to upper extremities.  No midline cervical spinal tenderness or midline thoracic lumbar tenderness.  She has equal pulses in all extremities.  Obvious deformity to her right ankle with some bruising, normal sensation, able to wiggle toes.  Neurological:     Mental Status: She is alert.  Psychiatric:        Mood and Affect: Mood normal.        Behavior: Behavior normal.     (all labs ordered are listed, but only abnormal results are displayed) Labs Reviewed  CBC - Abnormal; Notable for the following components:      Result Value   WBC 13.8 (*)    All other components within normal limits  BASIC METABOLIC PANEL WITH GFR - Abnormal; Notable for the following components:   Potassium 2.8 (*)    Chloride 95 (*)    Glucose,  Bld 124 (*)    All other components within normal limits    EKG: EKG Interpretation Date/Time:  Saturday March 17 2024 08:34:16 EST Ventricular Rate:  61 PR Interval:  144 QRS Duration:  83 QT Interval:  425 QTC Calculation: 429 R Axis:   24  Text Interpretation: Sinus rhythm Low voltage, precordial leads Abnormal R-wave progression, early transition Minimal ST depression, inferior leads Confirmed by Neysa Clap [phone]) on 03/17/2024 9:44:18 AM  Radiology: [facility] Ankle 2 Views Right Result Date: 03/17/2024 CLINICAL DATA:  post reduction/splint. EXAM: RIGHT ANKLE - 2 VIEW COMPARISON:  Same day radiograph FINDINGS: Osteopenia. Interval reduction of a RIGHT ankle dislocation with a mild persistent posterior subluxation of the talus in relation to the tibial plafond. Overlying cast material limits evaluation for fine osseous detail. Revisualization of a comminuted fracture of the distal fibula and displaced fracture of the medial malleolus. Limited assessment of the ankle mortise. Age-indeterminate fractures of the distal metatarsals. IMPRESSION: 1. Interval reduction of a RIGHT ankle dislocation with a mild persistent posterior subluxation of the talus in relation to the tibial plafond. 2. Revisualization of a comminuted fracture of the distal fibula and displaced fracture of the medial malleolus. 3. Age-indeterminate fractures of the distal metatarsals. Electronically Signed   By: Corean Salter M.D.   On: 03/17/2024 12:51   DG Foot Complete Right Result Date: 03/17/2024 CLINICAL DATA:  Fall.  Ankle deformity. EXAM: RIGHT FOOT COMPLETE - 3+ VIEW COMPARISON:  None Available. FINDINGS: Fracture subluxation noted at the ankle. Talus appears laterally subluxed relative to the distal tibia. This displacement is more prominent than on the limited two view ankle film performed earlier today. The cortical irregularity seen in the fourth and fifth metatarsals on the ankle study represents nonacute  old oblique fractures of the fourth and fifth metatarsals with evidence of healing. Degenerative changes noted in the MTP joint of the great toe. IMPRESSION: 1. Fracture subluxation at the ankle. Talus appears laterally subluxed relative to the distal tibia. This displacement is more prominent than on the limited two view ankle film performed earlier today. 2. Old fractures of the fourth and fifth metatarsals. Electronically Signed   By: Camellia Candle M.D.   On: 03/17/2024 11:23   DG Ribs Unilateral W/Chest Left Result Date: 03/17/2024 CLINICAL DATA:  Fall. EXAM: LEFT RIBS AND CHEST - 3+ VIEW COMPARISON:  None Available. FINDINGS: The lungs are clear without focal pneumonia, edema, pneumothorax or pleural effusion. Cardiopericardial silhouette is at upper limits of normal for size. Oblique views of the left ribs show no evidence for an  acute displaced left-sided rib fracture. Surgical clips are noted in the left axilla. IMPRESSION: Negative. Electronically Signed   By: Camellia Candle M.D.   On: 03/17/2024 10:01   DG Ankle 2 Views Right Result Date: 03/17/2024 CLINICAL DATA:  Fall.  Pain.  Ankle deformity. EXAM: RIGHT ANKLE - 2 VIEW COMPARISON:  No comparison studies available. FINDINGS: Two views study shows a comminuted distal fibular fracture above the level of the ankle mortise. Associated medial malleolar fracture. Assessment limited by oblique positioning on the AP projection, but there does appear to be widening of the medial tibiotalar joint space probably with some anterior widening as well. No overt dislocation. Posterior lip of the distal tibia not well visualized Lateral film raises the question of cortical irregularity in the distal fourth and fifth metatarsals. IMPRESSION: 1. Comminuted distal fibular fracture above the level of the ankle mortise with associated medial malleolar fracture. 2. Widening of the anterior and medial tibiotalar joint space. 3. Cortical irregularity in the distal fourth  and fifth metatarsals, incompletely visualized. Correlation for pain/tenderness in this region recommended and dedicated foot x-rays could be used to further evaluate as clinically warranted. Electronically Signed   By: Camellia Candle M.D.   On: 03/17/2024 09:59   CT Head Wo Contrast Result Date: 03/17/2024 EXAM: CT HEAD WITHOUT 03/17/2024 08:48:30 AM TECHNIQUE: CT of the head was performed without the administration of intravenous contrast. Automated exposure control, iterative reconstruction, and/or weight based adjustment of the mA/kV was utilized to reduce the radiation dose to as low as reasonably achievable. COMPARISON: Brain MRI 08/16/2005. CLINICAL HISTORY: 83 year old female fell at home with right ankle deformity. FINDINGS: BRAIN AND VENTRICLES: No acute intracranial hemorrhage. No mass effect or midline shift. No extra-axial fluid collection. No evidence of acute infarct. No hydrocephalus. Supratentorial brain volume is normal for age. Patchy and confluent bilateral cerebral white matter hypodensity, chronic but progressed since 2007. Small chronic left cerebellar infarct was present and is stable. Chronic nonspecific cerebellar volume loss (series 2 image 8). Mild for age calcified atherosclerosis at the skull base. No suspicious intracranial vascular hyperdensity. ORBITS: No acute abnormality. SINUSES AND MASTOIDS: Visible paranasal sinuses, tympanic cavities and mastoids are well aerated. SOFT TISSUES AND SKULL: No acute skull fracture. No acute soft tissue abnormality. IMPRESSION: 1. No acute traumatic injury identified. 2. Chronic cerebral white matter disease. Nonspecific cerebellar volume loss, but a small chronic left cerebellar infarct is stable since 2007. Electronically signed by: Helayne Hurst MD 03/17/2024 09:07 AM EST RP Workstation: [id number]     .Reduction of fracture  Date/Time: 03/17/2024 11:43 AM  Performed by: Neysa Caron PARAS, DO Authorized by: Neysa Caron PARAS, DO  Consent:  Written consent obtained Consent given by: patient Patient understanding: patient states understanding of the procedure being performed Patient consent: the patient's understanding of the procedure matches consent given Procedure consent: procedure consent matches procedure scheduled Relevant documents: relevant documents present and verified Test results: test results available and properly labeled Imaging studies: imaging studies available Patient identity confirmed: verbally with patient, arm band and hospital-assigned identification number Time out: Immediately prior to procedure a time out was called to verify the correct patient, procedure, equipment, support staff and site/side marked as required. Preparation: Patient was prepped and draped in the usual sterile fashion. Local anesthesia used: no  Anesthesia: Local anesthesia used: no  Sedation: Patient sedated: no  Patient tolerance: patient tolerated the procedure well with no immediate complications      Medications Ordered in the ED  heparin   injection 5,000 Units (5,000 Units Subcutaneous Given 03/17/24 1424)  acetaminophen  (TYLENOL ) tablet 1,000 mg (1,000 mg Oral Given 03/17/24 1420)  oxyCODONE  (Oxy IR/ROXICODONE ) immediate release tablet 5 mg (has no administration in time range)  HYDROmorphone  (DILAUDID ) injection 0.5 mg (has no administration in time range)  ondansetron  (ZOFRAN -ODT) disintegrating tablet 4 mg (has no administration in time range)  senna (SENOKOT) tablet 8.6 mg (8.6 mg Oral Given 03/17/24 1422)  polyethylene glycol (MIRALAX  / GLYCOLAX ) packet 17 g (has no administration in time range)  anastrozole  (ARIMIDEX ) tablet 1 mg (has no administration in time range)  atorvastatin  (LIPITOR) tablet 40 mg (has no administration in time range)  fluticasone  furoate-vilanterol (BREO ELLIPTA ) 200-25 MCG/ACT 1 puff (has no administration in time range)  metoprolol  succinate (TOPROL -XL) 24 hr tablet 100 mg (has no  administration in time range)  fentaNYL  (SUBLIMAZE ) injection 25 mcg (25 mcg Intravenous Given 03/17/24 0926)  potassium chloride  SA (KLOR-CON  M) CR tablet 40 mEq (40 mEq Oral Given 03/17/24 1048)  ketamine  50 mg in normal saline 5 mL (10 mg/mL) syringe (8.2 mg Intravenous Given 03/17/24 1153)  potassium chloride  SA (KLOR-CON  M) CR tablet 40 mEq (40 mEq Oral Given 03/17/24 1421)    Clinical Course as of 03/17/24 1526  Sat Mar 17, 2024  0924 CT Head Wo Contrast IMPRESSION: 1. No acute traumatic injury identified. 2. Chronic cerebral white matter disease. Nonspecific cerebellar volume loss, but a small chronic left cerebellar infarct is stable since 2007.   [TY]  1016 DG Ankle 2 Views Right MPRESSION: 1. Comminuted distal fibular fracture above the level of the ankle mortise with associated medial malleolar fracture. 2. Widening of the anterior and medial tibiotalar joint space. 3. Cortical irregularity in the distal fourth and fifth metatarsals, incompletely visualized. Correlation for pain/tenderness in this region recommended and dedicated foot x-rays could be used to further evaluate as clinically warranted.   Electronically Signed   By: Camellia Candle M.D.   On: 03/17/2024 09:59   [TY]  1137 Spoke with Dr. Georgina with Ortho, recommending splinting.  Make n.p.o. after midnight, plan for operative repair tomorrow. [TY]  1235 Spoke with family medicine who will see and admit patient. [TY]    Clinical Course User Index [TY] Neysa Caron PARAS, DO                                 Medical Decision Making This an 83 year old female presenting emergency department with ankle pain after fall.  Past medical history includes hypertension, AAA, hyperlipidemia, prior TIAs, prior stroke.  She is afebrile nontachycardic, hypertensive, maintaining oxygen saturation on room air.  Neurovascularly intact, but deformity to right ankle.  This seems to be her only injury.  Fall was mechanical basic  screening labs did reveal hypokalemia, repleted.  Normal kidney function.  Has a leukocytosis, but no anemia.  No overt infection on exam.  CT head given patient's age without acute traumatic injury.  X-rays independent reviewed does have what appears to be a bimalleolar fracture.  Rib x-rays given her recent fall with pain, no overt rib fractures, she is not tachypneic or hypoxic.  See ED course for further MDM and final disposition  Amount and/or Complexity of Data Reviewed Independent Historian: EMS    Details: EMS reported stable vitals.  No medications given prior to arrival External Data Reviewed:     Details: Not on a blood thinner Labs: ordered. Decision-making details documented in  ED Course.    Details: See above Radiology: ordered and independent interpretation performed. Decision-making details documented in ED Course.    Details: See above ECG/medicine tests: ordered.  Risk Prescription drug management. Parenteral controlled substances. Decision regarding hospitalization. Diagnosis or treatment significantly limited by social determinants of health.       Final diagnoses:  Closed fracture of right ankle, initial encounter    ED Discharge Orders     None          Neysa Caron PARAS, DO 03/17/24 1526

## 2024-03-17 NOTE — Plan of Care (Signed)
 [facility] Interim Progress Note  Assessed at bedside as part of night rounds.  Patient sitting up in bed watching a movie, denies she has any pain currently.  Ate dinner, denies any nausea or vomiting.  Agreeable to surgery with orthopedics tomorrow, wondering when procedure will take place.  O: BP 125/68   Pulse 72   Temp 98.4 F (36.9 C) (Oral)   Resp 18   Ht 5' (1.524 m)   Wt 54.4 kg   SpO2 91%   BMI 23.44 kg/m    General: Sitting up in bed, alert and oriented RESP: Normal work of breathing on room air MSK: Right lower extremity propped up with splint in place  A/P: Right ankle/fibula fracture Denies pain currently.  Will be n.p.o. at midnight pending orthopedic evaluation and likely surgical intervention tomorrow.  Patient agreeable to plan  Hypokalemia BMP ordered at 1700, not obtained therefore reordered as stat.  Will replete as indicated.  Remainder plan per day team.  Orders reviewed and updated as needed.  Theophilus Pagan, MD 03/17/2024, 8:20 PM PGY-3, [facility] Family Medicine Service pager [phone]

## 2024-03-17 NOTE — ED Triage Notes (Signed)
 Pt BIB [facility] from home after a fall. Pt has obvious ankle deformity of R ankle. Did not hit head, no thinners, no LOC. VSS. PMS intact. Aox4.

## 2024-03-17 NOTE — Consult Note (Signed)
 Orthopedic Surgery Consult Note  Assessment: Patient is a 83 y.o. female with right ankle fracture   Plan: -Operative plans: planning for operative fixation tomorrow -Diet: NPO at midnight -DVT ppx: aspirin  81mg  BID post-operatively -Ancef  and TXA on call to OR -Weight bearing status: NWB RLE -PT evaluate and treat -Pain control -Dispo: pending completion of operative plans   Discussed recommendation for operative intervention in the form of right ankle fracture open reduction internal fixation. Explained the risks of this procedure included, but were not limited to: nonunion, malunion, hardware failure, infection, bleeding, stiffness, posttraumatic arthritis, neurovascular injury, need for additional procedures, deep vein thrombosis, pulmonary embolism, and death. Explained that her risks of surgery would be higher given her active nicotine use, particularly for infection, wound healing complication, and nonunion. The benefits of this procedure would be to promote fracture healing by providing stability and to heal the fracture in the appropriate alignment. The alternatives of this surgery would be to treat the fracture with immobilization in a splint/brace/cast or to do no intervention. The patient's questions were answered to their satisfaction. After this discussion, patient elected to proceed with surgery. Informed consent was obtained.   ___________________________________________________________________________   Reason for consult: right ankle fracture  History:  Patient is a 83 y.o. female who had a ground-level fall earlier today at her home.  She noticed immediate onset of right ankle pain.  She is unable to weight-bear.  She was brought to [facility], ER.  She is reporting right ankle pain.  No pain elsewhere.  Past medical history:  AAA Imbalance Breast cancer Bladder tumor HLD HTN Emphysema History of stroke with residual left sided weakness  Allergies: penicillin,  morphine   Past surgical history:  Hysterectomy Breast lumpectomy Tonsillectomy Bladder cancer excision Left shoulder rotator cuff repair Appendectomy  Social history: Reports use of nicotine-containing products (cigarettes, vaping, smokeless, etc.) Alcohol use: rare Denies use of recreational drugs  Family history: -reviewed and not pertinent to ankle fracture   Physical Exam:  General: no acute distress, appears stated age Neurologic: alert, answering questions appropriately, following commands Cardiovascular: regular rate, no cyanosis Respiratory: unlabored breathing on room air, symmetric chest rise Psychiatric: appropriate affect, normal cadence to speech  MSK:   -Bilateral upper extremities  No tenderness to palpation over extremity, no gross deformity, no open wounds Fires deltoid, biceps, triceps, wrist extensors, wrist flexors, finger extensors, finger flexors  AIN/PIN/IO intact  Palpable radial pulse  Sensation intact to light touch in median/ulnar/radial/axillary nerve distributions  Hand warm and well perfused  -Left lower extremity  No tenderness to palpation over extremity, no gross deformity, no open wounds, no pain with log roll Fires hip flexors, quadriceps, hamstrings, tibialis anterior, gastrocnemius and soleus, extensor hallucis longus Plantarflexes and dorsiflexes toes Sensation intact to light touch in sural, saphenous, tibial, deep peroneal, and superficial peroneal nerve distributions Foot warm and well perfused  -Right lower extremity  No tenderness to palpation over extremity, leg in short leg splint, no open wounds seen, no pain with log roll in the hip  Fires hip flexors, quadriceps, hamstrings, tibialis anterior, gastrocnemius and soleus, extensor hallucis longus Plantarflexes and dorsiflexes toes Sensation intact to light touch in sural, saphenous, tibial, deep peroneal, and superficial peroneal nerve distributions Foot warm and well  perfused  Imaging: XRs of the right ankle from 03/17/2024 were independently reviewed and interpreted, showing a displaced bimalleolar ankle fracture. The medial malleolus consists of a very small fragment of bone. There is subluxation of the talus.  Chronic metatarsal fracture seen on the foot film   Patient name: Debra Keller Patient MRN: [id number] Date: 03/17/2024

## 2024-03-17 NOTE — Assessment & Plan Note (Signed)
 Current pain control adequate.  Orthopedic surgery following and plan for open reduction tomorrow.  S/p closed reduction in ED with interval improvement on x-ray.  History of osteoporosis on alendronate and reports taking. Admit to family medicine service with Dr. Delores, MedSurg floor, vitals per floor Pain control: Scheduled Tylenol  1000 mg every 6 hours, oxycodone  5 mg every 4 hours as needed, Dilaudid  0.5 mg IV every 4 hours as needed for breakthrough Bowel regiment senna and MiraLAX  daily Orthopedic surgery following, appreciate all their recommendations ORIF tomorrow Nonweightbearing right lower extremity Continue short leg splint until surgery Double baby ASA post op at DC

## 2024-03-17 NOTE — Assessment & Plan Note (Signed)
 Hypertension: Continue metoprolol  succinate 100 mg daily, currently holding hydrochlorothiazide 25 mg daily Hyperlipidemia: Resume atorvastatin  40 mg daily Hx CVA: Resume ASA COPD: Continue Advair or formulary equivalent History of breast cancer: Continue anastrozole  1 mg daily Osteoporosis: Hold alendronate Chronic metatarsal fractures: Found incidentally

## 2024-03-17 NOTE — ED Notes (Signed)
 Patient transported to CT

## 2024-03-17 NOTE — Assessment & Plan Note (Signed)
 Initially found to be hypokalemic to 2.8, no previous record of hypokalemia.  Likely secondary to hydrochlorothiazide.  Initially received 40 mill equivalents in ED Hold hydrochlorothiazide Ordered additional 40 mill equivalents Klor-Con  A.m. CBC and BMP

## 2024-03-17 NOTE — H&P (Cosign Needed Addendum)
 Hospital Admission History and Physical Service Pager: [phone]  Patient name: Debra Keller [facility] Medical record number: [id number] Date of Birth: 05-22-1940 Age: 83 y.o. Gender: female  Primary Care Provider: Massie Sewer, DO, [facility] Consultants: Ortho Code Status: Full Preferred Emergency Contact:   Contact Information     Name Relation Home Work Mobile   Boyack,GEORGE Spouse [phone]        Other Contacts   None on File      Chief Complaint: L ankle pain  Differential and Medical Decision Making:  [name] is a 83 y.o. female presenting with left ankle bilateral malleolar fracture.  Differential for this patient's presentation of this includes:  Ankle fracture: Most likely given x-ray findings with displaced ankle fracture, no other systemic symptoms, following mechanical fall Septic arthritis: Possible given acute pain of left ankle, mild edema, less likely given mild leukocytosis, nonerythematous, and x-ray findings Gout flare: Possible with acute unilateral joint pain, mild systemic symptoms, less likely given x-ray findings and no history of gout with mechanical fall. Assessment & Plan Displaced bimalleolar fracture of right ankle Closed nondisplaced comminuted fracture of shaft of right fibula Current pain control adequate.  Orthopedic surgery following and plan for open reduction tomorrow.  S/p closed reduction in ED with interval improvement on x-ray.  History of osteoporosis on alendronate and reports taking. Admit to family medicine service with Dr. Delores, MedSurg floor, vitals per floor Pain control: Scheduled Tylenol  1000 mg every 6 hours, oxycodone  5 mg every 4 hours as needed, Dilaudid  0.5 mg IV every 4 hours as needed for breakthrough Bowel regiment senna and MiraLAX  daily Orthopedic surgery following, appreciate all their recommendations ORIF tomorrow Nonweightbearing right lower  extremity Continue short leg splint until surgery Double baby ASA post op at DC Hypokalemia Initially found to be hypokalemic to 2.8, no previous record of hypokalemia.  Likely secondary to hydrochlorothiazide.  Initially received 40 mill equivalents in ED Hold hydrochlorothiazide Ordered additional 40 mill equivalents Klor-Con  A.m. CBC and BMP Chronic health problem Hypertension: Continue metoprolol  succinate 100 mg daily, currently holding hydrochlorothiazide 25 mg daily Hyperlipidemia: Resume atorvastatin  40 mg daily Hx CVA: Resume ASA COPD: Continue Advair or formulary equivalent History of breast cancer: Continue anastrozole  1 mg daily Osteoporosis: Hold alendronate Chronic metatarsal fractures: Found incidentally   FEN/GI: Regular diet, n.p.o. at midnight VTE Prophylaxis: Subcutaneous heparin , stop after midnight  Disposition: Home with home PT  History of Present Illness:  Syncere Keller is a 83 y.o. female presenting with right  ankle pain following mechanical fall this morning  Reports chronic issues with balance, walks with a cane when she goes outside but does not use cane when walking around house. This AM, was walking with a cup of coffee in hand, tripped over her own feet. She fell on top of her feet. No rugs or obstacles. Husband called EMS. Reports backing into and tripping into a counter a few days ago. Reports she had rib pain from that fall too. Denies head trauma. Reports had wall to wall rugs. Pain is currently 2/10, no nausea.   Denies taking any blood thinner, not taking eliquis. Does take a baby aspirin .   Last BM yesterday.  In the ED, vital signs were stable, CT head unremarkable, patient endorsed severe pain received fentanyl  and ketamine  for closed reduction.  Initial labs found mild leukocytosis and hypokalemia and was repleted.  Initial right  ankle x-ray found comminuted fibular fracture and ankle fracture with subluxation.  Close reduction  was done and interval ankle x-ray found improvement.  Review Of Systems: Per HPI with the following additions: Denies abm pain, weight loss, vision change, headaches, dysuria, swelling, chest pain, shortness of breath.  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   Pertinent Past Surgical History: Right lumpectomy 2008 Left lumpectomy 2021 Transurethral resection of bladder tumor 01/02/2018 and 02/03/2018 Shoulder open rotator cuff 2000's Abdominal hysterectomy 1980s Appendectomy Remainder reviewed in history tab.   Pertinent Social History: Smokes since 87, smokes about 3 cigs a day. Denies alcohol or drug use. Married for 38 yrs.   Pertinent Family History: None .   Important Outpatient Medications: metoprolol  succinate 100 mg daily hydrochlorothiazide 25 mg daily atorvastatin  40 mg daily Aspirin  81 mg daily Advair daily Anastrozole  1 mg daily Alendronate  Objective: BP 120/63   Pulse 63   Temp 97.9 F (36.6 C) (Oral)   Resp 18   Ht 5' (1.524 m)   Wt 54.4 kg   SpO2 96%   BMI 23.44 kg/m  Exam: General: Resting comfortably in no acute distress  Cardiovascular: Regular rate rhythm, normal S1 and S2, no murmurs rubs or gallops, 2+ bilateral radial pulses Respiratory: Clear to auscultation bilaterally, no wheezes or crackles Abdomen: Active bowel sounds, nondistended, nontender MSK: Right foot and short leg splint Skin: Warm and dry Extremities: Nonedematous, nontender bilateral lower extremities   Labs:  CBC BMET  Recent Labs  Lab 03/17/24 0919  WBC 13.8*  HGB 14.4  HCT 43.4  PLT 259   Recent Labs  Lab 03/17/24 0919  NA 141  K 2.8*  CL 95*  CO2 32  BUN 15  CREATININE 0.87  GLUCOSE 124*  CALCIUM  9.3       EKG:  Sinus rhythm, regular rate, no ST-T abnormalities, abnormal R wave progression with early transition, likely 2/2 low voltage/lead placement  Imaging Studies Performed:  CT head without contrast IMPRESSION: 1. No acute traumatic injury identified. 2. Chronic cerebral white matter disease. Nonspecific cerebellar volume loss, but a small chronic left cerebellar infarct is stable since 2007.  DG ribs unilateral with chest left IMPRESSION: Negative.  DG foot complete right IMPRESSION: 1. Fracture subluxation at the ankle. Talus appears laterally subluxed relative to the distal tibia. This displacement is more prominent than on the limited two view ankle film performed earlier today. 2. Old fractures of the fourth and fifth metatarsals.  DG ankle 2 views right IMPRESSION: 1. Interval reduction of a RIGHT ankle dislocation with a mild persistent posterior subluxation of the talus in relation to the tibial plafond. 2. Revisualization of a comminuted fracture of the distal fibula and displaced fracture of the medial malleolus. 3. Age-indeterminate fractures of the distal metatarsals.  Lorrane Pac, MD 03/17/2024, 12:27 PM PGY-1, [facility] Family Medicine  FPTS Intern pager: [phone], text pages welcome Secure chat group [facility] Teaching Service

## 2024-03-17 NOTE — ED Notes (Signed)
Pt alert and tolerated procedure well

## 2024-03-18 ENCOUNTER — Inpatient Hospital Stay (HOSPITAL_COMMUNITY)

## 2024-03-18 ENCOUNTER — Encounter (HOSPITAL_COMMUNITY): Payer: Self-pay | Admitting: Family Medicine

## 2024-03-18 ENCOUNTER — Inpatient Hospital Stay (HOSPITAL_COMMUNITY): Admitting: Anesthesiology

## 2024-03-18 ENCOUNTER — Encounter (HOSPITAL_COMMUNITY): Admission: EM | Disposition: A | Payer: Self-pay | Source: Home / Self Care | Attending: Family Medicine

## 2024-03-18 DIAGNOSIS — S82891A Other fracture of right lower leg, initial encounter for closed fracture: Principal | ICD-10-CM

## 2024-03-18 LAB — BASIC METABOLIC PANEL WITH GFR
Anion gap: 12 (ref 5–15)
BUN: 20 mg/dL (ref 8–23)
CO2: 27 mmol/L (ref 22–32)
Calcium: 9 mg/dL (ref 8.9–10.3)
Chloride: 99 mmol/L (ref 98–111)
Creatinine, Ser: 0.79 mg/dL (ref 0.44–1.00)
GFR, Estimated: >60 mL/min (ref >60)
Glucose, Bld: 114 mg/dL — ABNORMAL HIGH (ref 70–99)
Potassium: 3.8 mmol/L (ref 3.5–5.1)
Sodium: 138 mmol/L (ref 135–145)

## 2024-03-18 LAB — CBC
HCT: 39.8 % (ref 36.0–46.0)
Hemoglobin: 13.5 g/dL (ref 12.0–15.0)
MCH: 31.5 pg (ref 26.0–34.0)
MCHC: 33.9 g/dL (ref 30.0–36.0)
MCV: 93 fL (ref 80.0–100.0)
Platelets: 247 K/uL (ref 150–400)
RBC: 4.28 MIL/uL (ref 3.87–5.11)
RDW: 13.6 % (ref 11.5–15.5)
WBC: 12.5 K/uL — ABNORMAL HIGH (ref 4.0–10.5)
nRBC: 0 % (ref 0.0–0.2)

## 2024-03-18 SURGERY — OPEN REDUCTION INTERNAL FIXATION (ORIF) ANKLE FRACTURE
Anesthesia: General | Site: Ankle | Laterality: Right

## 2024-03-18 MED ORDER — BUPIVACAINE-EPINEPHRINE (PF) 0.5% -1:200000 IJ SOLN
INTRAMUSCULAR | Status: DC | PRN
  Administered 2024-03-18: 10 mL via PERINEURAL
  Administered 2024-03-18: 30 mL via PERINEURAL

## 2024-03-18 MED ORDER — FENTANYL CITRATE (PF) 100 MCG/2ML IJ SOLN: 25.0000 ug | INTRAMUSCULAR | Status: DC | PRN

## 2024-03-18 MED ORDER — CEFAZOLIN SODIUM-DEXTROSE 2-4 GM/100ML-% IV SOLN
INTRAVENOUS | Status: AC
  Filled 2024-03-18: qty 100

## 2024-03-18 MED ORDER — PROPOFOL 10 MG/ML IV BOLUS
INTRAVENOUS | Status: AC
  Filled 2024-03-18: qty 20

## 2024-03-18 MED ORDER — ASPIRIN 81 MG PO TBEC: 81.0000 mg | DELAYED_RELEASE_TABLET | Freq: Every day | ORAL | Status: DC

## 2024-03-18 MED ORDER — FENTANYL CITRATE (PF) 250 MCG/5ML IJ SOLN
INTRAMUSCULAR | Status: AC
  Filled 2024-03-18: qty 5

## 2024-03-18 MED ORDER — ONDANSETRON HCL 4 MG/2ML IJ SOLN
INTRAMUSCULAR | Status: DC | PRN
  Administered 2024-03-18: 4 mg via INTRAVENOUS

## 2024-03-18 MED ORDER — OXYCODONE HCL 5 MG PO TABS
2.5000 mg | ORAL_TABLET | ORAL | Status: DC | PRN
  Filled 2024-03-18: qty 1

## 2024-03-18 MED ORDER — CHLORHEXIDINE GLUCONATE 0.12 % MT SOLN
15.0000 mL | Freq: Once | OROMUCOSAL | Status: AC
  Administered 2024-03-18: 15 mL via OROMUCOSAL

## 2024-03-18 MED ORDER — TRANEXAMIC ACID-NACL 1000-0.7 MG/100ML-% IV SOLN
1000.0000 mg | Freq: Once | INTRAVENOUS | Status: AC
  Administered 2024-03-18: 1000 mg via INTRAVENOUS
  Filled 2024-03-18: qty 100

## 2024-03-18 MED ORDER — PHENYLEPHRINE 80 MCG/ML (10ML) SYRINGE FOR IV PUSH (FOR BLOOD PRESSURE SUPPORT)
PREFILLED_SYRINGE | INTRAVENOUS | Status: DC | PRN
  Administered 2024-03-18 (×3): 80 ug via INTRAVENOUS

## 2024-03-18 MED ORDER — CEFAZOLIN SODIUM-DEXTROSE 2-4 GM/100ML-% IV SOLN
2.0000 g | Freq: Three times a day (TID) | INTRAVENOUS | Status: AC
  Administered 2024-03-18 – 2024-03-19 (×3): 2 g via INTRAVENOUS
  Filled 2024-03-18 (×3): qty 100

## 2024-03-18 MED ORDER — ORAL CARE MOUTH RINSE: 15.0000 mL | Freq: Once | OROMUCOSAL | Status: AC

## 2024-03-18 MED ORDER — PHENYLEPHRINE HCL-NACL 20-0.9 MG/250ML-% IV SOLN
INTRAVENOUS | Status: DC | PRN
  Administered 2024-03-18: 35 ug/min via INTRAVENOUS

## 2024-03-18 MED ORDER — FENTANYL CITRATE (PF) 250 MCG/5ML IJ SOLN
INTRAMUSCULAR | Status: DC | PRN
  Administered 2024-03-18: 50 ug via INTRAVENOUS

## 2024-03-18 MED ORDER — VANCOMYCIN HCL 1000 MG IV SOLR
INTRAVENOUS | Status: AC
  Filled 2024-03-18: qty 20

## 2024-03-18 MED ORDER — LACTATED RINGERS IV SOLN: INTRAVENOUS | Status: DC

## 2024-03-18 MED ORDER — TRANEXAMIC ACID-NACL 1000-0.7 MG/100ML-% IV SOLN
INTRAVENOUS | Status: AC
  Filled 2024-03-18: qty 100

## 2024-03-18 MED ORDER — ASPIRIN 81 MG PO CHEW
81.0000 mg | CHEWABLE_TABLET | Freq: Two times a day (BID) | ORAL | Status: DC
  Administered 2024-03-18 – 2024-03-19 (×3): 81 mg via ORAL
  Filled 2024-03-18 (×3): qty 1

## 2024-03-18 MED ORDER — SUGAMMADEX SODIUM 200 MG/2ML IV SOLN
INTRAVENOUS | Status: DC | PRN
  Administered 2024-03-18: 100 mg via INTRAVENOUS

## 2024-03-18 MED ORDER — 0.9 % SODIUM CHLORIDE (POUR BTL) OPTIME
TOPICAL | Status: DC | PRN
  Administered 2024-03-18: 1000 mL

## 2024-03-18 MED ORDER — PROPOFOL 10 MG/ML IV BOLUS
INTRAVENOUS | Status: DC | PRN
  Administered 2024-03-18: 70 mg via INTRAVENOUS
  Administered 2024-03-18: 100 ug/kg/min via INTRAVENOUS

## 2024-03-18 MED ORDER — ROCURONIUM BROMIDE 10 MG/ML (PF) SYRINGE
PREFILLED_SYRINGE | INTRAVENOUS | Status: DC | PRN
  Administered 2024-03-18: 40 mg via INTRAVENOUS

## 2024-03-18 MED ORDER — PHENYLEPHRINE 80 MCG/ML (10ML) SYRINGE FOR IV PUSH (FOR BLOOD PRESSURE SUPPORT): PREFILLED_SYRINGE | INTRAVENOUS | Status: DC | PRN

## 2024-03-18 MED ORDER — ACETAMINOPHEN 500 MG PO TABS
1000.0000 mg | ORAL_TABLET | Freq: Three times a day (TID) | ORAL | Status: DC
  Administered 2024-03-18 – 2024-03-20 (×6): 1000 mg via ORAL
  Filled 2024-03-18 (×6): qty 2

## 2024-03-18 MED ORDER — DEXAMETHASONE SOD PHOSPHATE PF 10 MG/ML IJ SOLN
INTRAMUSCULAR | Status: DC | PRN
  Administered 2024-03-18: 10 mg via INTRAVENOUS

## 2024-03-18 MED ORDER — CEFAZOLIN SODIUM-DEXTROSE 2-3 GM-%(50ML) IV SOLR
INTRAVENOUS | Status: DC | PRN
  Administered 2024-03-18: 2 g via INTRAVENOUS

## 2024-03-18 MED ORDER — LACTATED RINGERS IV SOLN: INTRAVENOUS | Status: DC | PRN

## 2024-03-18 MED ORDER — CHLORHEXIDINE GLUCONATE 0.12 % MT SOLN
OROMUCOSAL | Status: AC
  Filled 2024-03-18: qty 15

## 2024-03-18 SURGICAL SUPPLY — 47 items
BAG COUNTER SPONGE SURGICOUNT (BAG) ×1 IMPLANT
BIT DRILL QC 2.0 SHORT EVOS SM (DRILL) IMPLANT
BIT DRILL QC 2.5MM SHRT EVO SM (DRILL) IMPLANT
BNDG COHESIVE 4X5 TAN STRL LF (GAUZE/BANDAGES/DRESSINGS) ×1 IMPLANT
BNDG ELASTIC 4INX 5YD STR LF (GAUZE/BANDAGES/DRESSINGS) IMPLANT
BNDG ELASTIC 6INX 5YD STR LF (GAUZE/BANDAGES/DRESSINGS) IMPLANT
CANISTER SUCTION 3000ML PPV (SUCTIONS) ×1 IMPLANT
COVER SURGICAL LIGHT HANDLE (MISCELLANEOUS) ×1 IMPLANT
CUFF TRNQT CYL 34X4.125X (TOURNIQUET CUFF) ×1 IMPLANT
DRAPE C-ARM 42X72 X-RAY (DRAPES) ×1 IMPLANT
DRAPE C-ARMOR (DRAPES) ×1 IMPLANT
DRAPE U-SHAPE 47X51 STRL (DRAPES) ×1 IMPLANT
DRSG XEROFORM 1X8 (GAUZE/BANDAGES/DRESSINGS) IMPLANT
DURAPREP 26ML APPLICATOR (WOUND CARE) ×1 IMPLANT
ELECT COATED BLADE 2.86 ST (ELECTRODE) ×1 IMPLANT
ELECTRODE REM PT RTRN 9FT ADLT (ELECTROSURGICAL) ×1 IMPLANT
GAUZE PAD ABD 8X10 STRL (GAUZE/BANDAGES/DRESSINGS) IMPLANT
GAUZE SPONGE 4X4 12PLY STRL (GAUZE/BANDAGES/DRESSINGS) ×1 IMPLANT
GLOVE INDICATOR 7.5 STRL GRN (GLOVE) ×1 IMPLANT
GLOVE SS BIOGEL STRL SZ 7.5 (GLOVE) ×1 IMPLANT
GOWN STRL REUS W/ TWL LRG LVL3 (GOWN DISPOSABLE) ×1 IMPLANT
GOWN STRL REUS W/ TWL XL LVL3 (GOWN DISPOSABLE) ×1 IMPLANT
GOWN STRL SURGICAL XL XLNG (GOWN DISPOSABLE) ×2 IMPLANT
KIT BASIN OR (CUSTOM PROCEDURE TRAY) ×1 IMPLANT
KIT INVISIKNOT ANKLE FRACTURE (Screw) IMPLANT
KIT TURNOVER KIT B (KITS) ×1 IMPLANT
KWIRE FX150X1.6XTROC PNT (WIRE) IMPLANT
PACK ORTHO EXTREMITY (CUSTOM PROCEDURE TRAY) ×1 IMPLANT
PAD ARMBOARD POSITIONER FOAM (MISCELLANEOUS) ×2 IMPLANT
PAD CAST 4YDX4 CTTN HI CHSV (CAST SUPPLIES) ×2 IMPLANT
PLATE FIBULA 9H RT (Plate) IMPLANT
SCREW CORT 2.7X12 EVOS (Screw) IMPLANT
SCREW CORT 2.7X14 T8 EVOS (Screw) IMPLANT
SCREW CORT 3.5X14 ST EVOS (Screw) IMPLANT
SCREW CORT EVOS ST 3.5X12 (Screw) IMPLANT
SCREW EVOS 2.7X11 LOCK T8 (Screw) IMPLANT
SCREW LOCK 2.7X13 ST EVOS (Screw) IMPLANT
SOLN 0.9% NACL POUR BTL 1000ML (IV SOLUTION) ×1 IMPLANT
SPLINT PLASTER CAST XFAST 5X30 (CAST SUPPLIES) IMPLANT
SPONGE T-LAP 18X18 ~~LOC~~+RFID (SPONGE) ×1 IMPLANT
SUCTION TUBE FRAZIER 10FR DISP (SUCTIONS) ×1 IMPLANT
SUT ETHILON 2 0 FS 18 (SUTURE) ×1 IMPLANT
SUT VIC AB 2-0 CT1 18 (SUTURE) ×1 IMPLANT
SUT VIC AB 2-0 CT2 18 VCP726D (SUTURE) ×1 IMPLANT
TOWEL GREEN STERILE (TOWEL DISPOSABLE) ×1 IMPLANT
TOWEL GREEN STERILE FF (TOWEL DISPOSABLE) ×1 IMPLANT
TUBE CONNECTING 12X1/4 (SUCTIONS) ×1 IMPLANT

## 2024-03-18 NOTE — Anesthesia Postprocedure Evaluation (Signed)
 Anesthesia Post Note  Patient: Debra Keller  Procedure(s) Performed: OPEN REDUCTION INTERNAL FIXATION (ORIF) ANKLE FRACTURE (Right: Ankle)     Patient location during evaluation: PACU Anesthesia Type: General and Regional Level of consciousness: awake and alert Pain management: pain level controlled Vital Signs Assessment: post-procedure vital signs reviewed and stable Respiratory status: spontaneous breathing, nonlabored ventilation and respiratory function stable Cardiovascular status: blood pressure returned to baseline and stable Postop Assessment: no apparent nausea or vomiting Anesthetic complications: no   No notable events documented.  Last Vitals:  Vitals:   03/18/24 1015 03/18/24 1029  BP: (!) 116/51 (!) 123/49  Pulse: 70 71  Resp: 17 17  Temp: 36.6 C (!) 36.3 C  SpO2: 98% 93%    Last Pain:  Vitals:   03/18/24 1029  TempSrc: Oral  PainSc:                  Elias Dennington,W. EDMOND

## 2024-03-18 NOTE — Anesthesia Procedure Notes (Signed)
 Anesthesia Regional Block: Popliteal block   Pre-Anesthetic Checklist: , timeout performed,  Correct Patient, Correct Site, Correct Laterality,  Correct Procedure, Correct Position, site marked,  Risks and benefits discussed,  Pre-op evaluation,  At surgeon's request and post-op pain management  Laterality: Right  Prep: Maximum Sterile Barrier Precautions used, chloraprep       Needles:  Injection technique: Single-shot  Needle Type: Echogenic Stimulator Needle     Needle Length: 9cm  Needle Gauge: 21     Additional Needles:   Procedures:,,,, ultrasound used (permanent image in chart),,    Narrative:  Start time: 03/18/2024 7:07 AM End time: 03/18/2024 7:17 AM Injection made incrementally with aspirations every 5 mL.  Performed by: Personally  Anesthesiologist: Epifanio Fallow, MD

## 2024-03-18 NOTE — Assessment & Plan Note (Addendum)
 Orthopedics with plan for operative stabilization today. -Orthopedic surgery following, appreciate recs  - ORIF 12/14  - NWB RLE  - DVT Ppx: 81 mg BID post op - Pain control: Scheduled Tylenol  every 6 hours, oxycodone  5 mg every 4 hours, Dilaudid  0.5 mg IV every 4 hours as needed for breakthrough pain - bowel reg: miralax  and senna daily  - AM CBC

## 2024-03-18 NOTE — Plan of Care (Signed)
 Discussed with Dr. Georgina, orthopaedics, regarding DVT ppx. Confirmed that he would like ASA 81mg  BID as prophylaxis

## 2024-03-18 NOTE — Anesthesia Procedure Notes (Signed)
 Procedure Name: Intubation Date/Time: 03/18/2024 7:56 AM  Performed by: Arvell Edsel HERO, CRNAPre-anesthesia Checklist: Patient identified, Emergency Drugs available, Suction available, Patient being monitored and Timeout performed Patient Re-evaluated:Patient Re-evaluated prior to induction Oxygen Delivery Method: Circle system utilized Preoxygenation: Pre-oxygenation with 100% oxygen Induction Type: IV induction Ventilation: Mask ventilation without difficulty Laryngoscope Size: Mac and 3 Grade View: Grade I Tube type: Oral Tube size: 6.5 mm Number of attempts: 1 Placement Confirmation: ETT inserted through vocal cords under direct vision, positive ETCO2 and breath sounds checked- equal and bilateral Secured at: 20 cm Tube secured with: Tape Dental Injury: Teeth and Oropharynx as per pre-operative assessment

## 2024-03-18 NOTE — Anesthesia Preprocedure Evaluation (Addendum)
 Anesthesia Evaluation  Patient identified by MRN, date of birth, ID band Patient awake    Reviewed: Allergy & Precautions, H&P , NPO status , Patient's Chart, lab work & pertinent test results, reviewed documented beta blocker date and time   Airway Mallampati: I  TM Distance: >3 FB Neck ROM: Full    Dental no notable dental hx. (+) Edentulous Upper, Edentulous Lower, Dental Advisory Given   Pulmonary COPD,  COPD inhaler, Current Smoker   Pulmonary exam normal breath sounds clear to auscultation       Cardiovascular hypertension, Pt. on medications and Pt. on home beta blockers  Rhythm:Regular Rate:Normal     Neuro/Psych CVA, Residual Symptoms  negative psych ROS   GI/Hepatic negative GI ROS, Neg liver ROS,,,  Endo/Other  negative endocrine ROS    Renal/GU negative Renal ROS  negative genitourinary   Musculoskeletal   Abdominal   Peds  Hematology negative hematology ROS (+)   Anesthesia Other Findings   Reproductive/Obstetrics negative OB ROS                              Anesthesia Physical Anesthesia Plan  ASA: 3  Anesthesia Plan: General   Post-op Pain Management: Regional block* and Tylenol  PO (pre-op)*   Induction: Intravenous  PONV Risk Score and Plan: 2 and Ondansetron , Dexamethasone , TIVA and Propofol  infusion  Airway Management Planned: Oral ETT  Additional Equipment:   Intra-op Plan:   Post-operative Plan: Extubation in OR  Informed Consent: I have reviewed the patients History and Physical, chart, labs and discussed the procedure including the risks, benefits and alternatives for the proposed anesthesia with the patient or authorized representative who has indicated his/her understanding and acceptance.     Dental advisory given  Plan Discussed with: CRNA  Anesthesia Plan Comments:          Anesthesia Quick Evaluation

## 2024-03-18 NOTE — Progress Notes (Signed)
 Orthopedic Surgery Progress Note   Assessment: Patient is a 83 y.o. female with right ankle fracture   Plan: -Planning for operative stabilization today -Diet: NPO for procedure -DVT ppx: aspirin  81mg  BID post-operatively -Ancef  and TXA on call to OR -Weight bearing status: NWB RLE -Consent verified, site marked -PT evaluate and treat post-op -Pain control -Dispo: To OR when ready  ___________________________________________________________________________  Subjective: No acute events overnight. Pain well controlled. No further questions about surgery or plan.   Physical Exam:  General: no acute distress, appears stated age Neurologic: alert, answering questions appropriately, following commands Respiratory: unlabored breathing on room air, symmetric chest rise Psychiatric: appropriate affect, normal cadence to speech  MSK:   -Right lower extremity  Short leg splint in place EHL/TA/GSC intact Plantarflexes and dorsiflexes toes Sensation intact to light touch over dorsal and plantar aspect of toes Foot warm and well perfused   Yesterday's total administered Morphine Milligram Equivalents: 15   Patient name: Debra Keller Patient MRN: [id number] Date: @TODAY @

## 2024-03-18 NOTE — Progress Notes (Signed)
 OT Cancellation Note  Patient Details Name: Debra Keller MRN: [id number] DOB: 1940/12/30   Cancelled Treatment:    Reason Eval/Treat Not Completed: Other (comment) (pending OR today for Right ankle/fibula fracture OT will hold until post surgery)  Ely Molt 03/18/2024, 6:36 AM

## 2024-03-18 NOTE — Progress Notes (Addendum)
° ° ° °  Daily Progress Note Intern Pager: [phone]  Patient name: Debra Keller [facility] Medical record number: [id number] Date of birth: 30-Aug-1940 Age: 83 y.o. Gender: female  Primary Care Provider: Valentin Skates, DO Consultants: Orthopedic surgery Code Status: Full  Pt Overview and Major Events to Date:  12/13: Admitted 12/14: ORIF of R ankle   Medical Decision Making: Patient is an 83 year old female who presented with right ankle bilateral malleoli fracture s/p ORIF on 12/14.   Pertinent past medical history includes hypertension: Hyperlipidemia: History of CVA Assessment & Plan Displaced bimalleolar fracture of right ankle Closed nondisplaced comminuted fracture of shaft of right fibula Orthopedics with plan for operative stabilization today. -Orthopedic surgery following, appreciate recs  - ORIF 12/14  - NWB RLE  - DVT Ppx: 81 mg BID post op - Pain control: Scheduled Tylenol  every 6 hours, oxycodone  5 mg every 4 hours, Dilaudid  0.5 mg IV every 4 hours as needed for breakthrough pain - bowel reg: miralax  and senna daily  - AM CBC Hypokalemia S/p repletion.   - AM BMP - continue to hold hydrochlorothiazide  Chronic health problem HTN: Continue home metoprolol  100 mg daily, holding HCTZ pending a.m. BMP HLD: Resume atorvastatin  40 mg daily History of CVA: Resume ASA COPD: Continue Advair Hx breast cancer: Continue anastrozole  1 mg daily Osteoporosis: Holding all daughter right while admitted  FEN/GI: N.p.o. pending surgery PPx: Plan to restart after surgery Dispo: Pending clinical improvement  Subjective:  Reports she is doing great, no complaints. Feels surgery went well.   Objective: Temp:  [97.5 F (36.4 C)-98.4 F (36.9 C)] 97.8 F (36.6 C) (12/14 0700) Pulse Rate:  [63-72] 68 (12/14 0700) Resp:  [18-22] 18 (12/14 0700) BP: (97-148)/(46-68) 131/62 (12/14 0700) SpO2:  [91 %-98 %] 93 % (12/14 0700) Weight:  [54.4 kg] 54.4 kg (12/13 0831) Physical  Exam: General: frail older woman, NAD Cardiovascular: RRR Respiratory: No increased WOB, CTAB Abdomen: soft, NTND Extremities: R leg with boot, moving toes bilaterally   Laboratory: Most recent CBC Lab Results  Component Value Date   WBC 12.5 (H) 03/18/2024   HGB 13.5 03/18/2024   HCT 39.8 03/18/2024   MCV 93.0 03/18/2024   PLT 247 03/18/2024   Most recent BMP    Latest Ref Rng & Units 03/18/2024    3:40 AM  BMP  Glucose 70 - 99 mg/dL 885   BUN 8 - 23 mg/dL 20   Creatinine 9.55 - 1.00 mg/dL 9.20   Sodium 864 - 854 mmol/L 138   Potassium 3.5 - 5.1 mmol/L 3.8   Chloride 98 - 111 mmol/L 99   CO2 22 - 32 mmol/L 27   Calcium  8.9 - 10.3 mg/dL 9.0    Imaging/Diagnostic Tests:  No new images Lonnie Makenzie Vittorio, MD 03/18/2024, 7:59 AM  PGY-2, [place] [facility] FPTS Intern pager: [phone], text pages welcome Secure chat group [facility] Teaching Service

## 2024-03-18 NOTE — Discharge Instructions (Addendum)
 Orthopedic Surgery Discharge Instructions  Patient name: Debra Keller [facility] Fracture: right ankle fracture Procedure Performed: right ankle fracture open reduction internal fixation Date of Surgery: 03/18/2024 Surgeon: Ozell Ada, MD  Activity: You should not put any weight on your right lower extremity. The plates and screws placed in your ankle to keep the fracture in alignment are not designed to put weight through them.  Incision Care: Your incision is underneath the splint. You should leave the splint on at all times. You will need to keep the splint dry at all times. You can use a waterproof bag over the leg when bathing to prevent water from getting on to the splint. You should not place anything down the splint. The splint will be removed at your first office visit and the sutures at the incision will be removed. If you notice drainage coming out from the splint, please call the office for further instructions.   Medications: You have been prescribed oxycodone . This is a narcotic pain medication and should only be taken as prescribed. You should not drink alcohol or operate heavy machinery (including driving) while taking this medication. The oxycodone  can cause constipation as a side effect. For that reason, you have been prescribed senna and miralax . These are both laxatives. You do not need to take this medication if you develop diarrhea. Should you remain constipated even while taking the senna and miralax , please use the miralax  twice daily. Tylenol  has been prescribed to be taken every 8 hours, which will give you additional pain relief.   You have been prescribed aspirin  as a blood thinner. This medication is to be taken to prevent blood clots. Take 81 milligrams twice daily. You should refrain from using other blood thinners (warfarin, apixaban, plavix, xarelto, etc.) while using the aspirin . You will need to take this medication for a total of 6 weeks after your surgery.    You should not use over-the-counter NSAIDs (ibuprofen, Aleve, Celebrex, naproxen, meloxicam, etc.) for pain relief because aspirin  is a similar medication. There can be side effects including but not limited to kidney injury and ulcers if you take these type of medications with the aspirin .  In order to set expectations for opioid prescriptions, you will only be prescribed opioids for a total of six weeks after surgery and, at two-weeks after surgery, your opioid prescription will start to tapered (decreased dosage and number of pills). If you have ongoing need for opioid medication six weeks after surgery, you will be referred to pain management. If you are already established with a provider that is giving you opioid medications, you should schedule an appointment with them for six weeks after surgery if you feel you are going to need another prescription. State law only allows for opioid prescriptions one week at a time. If you are running out of opioid medication near the end of the week, please call the office during business hours before running out so I can send you another prescription.   Driving: You will not be able to drive for at least six weeks. Dr. Ada will advise you when you can return to driving during your office visits.    Diet: You are safe to resume your regular diet after surgery.   Reasons to Call the Office After Surgery: You should feel free to call the office with any concerns or questions you have in the post-operative period, but you should definitely notify the office if you develop: -shortness of breath, chest pain, or trouble breathing -excessive  bleeding, drainage, redness, or swelling around the surgical site -fevers, chills, or pain that is getting worse with each passing day -persistent nausea or vomiting -new weakness in the right lower extremity, new or worsening numbness or tingling in the right lower extremity -other concerns about your surgery  Follow Up  Appointments: You have a follow up appointment scheduled with Dr. Georgina on 04/04/2024 at 4:15pm. The office location and phone number are listed below. Please arrive on time to your appointment.   Office Information:  -Ozell Georgina, MD -Phone number: [phone] -Address: [address]       [place], [place] [address]

## 2024-03-18 NOTE — Brief Op Note (Signed)
 03/18/2024  9:40 AM  PATIENT:  Debra Keller  83 y.o. female  PRE-OPERATIVE DIAGNOSIS:  RIGHT ANKLE FRACTURE  POST-OPERATIVE DIAGNOSIS:  RIGHT ANKLE FRACTURE  PROCEDURE:  Procedures: OPEN REDUCTION INTERNAL FIXATION (ORIF) ANKLE FRACTURE (Right)  SURGEON:  Surgeons and Role:    DEWAINE Georgina Ozell DELENA, MD - Primary  PHYSICIAN ASSISTANT: none  ASSISTANTS: none   ANESTHESIA:  regional and sedation  EBL:  30cc   BLOOD ADMINISTERED:none  DRAINS: none   LOCAL MEDICATIONS USED:  NONE  SPECIMEN:  No Specimen  DISPOSITION OF SPECIMEN:  N/A  COUNTS:  YES  TOURNIQUET:  NONE  DICTATION: .Note written in EPIC  PLAN OF CARE: Admit to inpatient   PATIENT DISPOSITION:  PACU - hemodynamically stable.   Delay start of Pharmacological VTE agent (>24hrs) due to surgical blood loss or risk of bleeding: no

## 2024-03-18 NOTE — Op Note (Signed)
 Orthopedic Surgery Operative Report   Procedure: Right bimalleolar ankle fracture open reduction internal fixation Application of short leg splint   Modifier: none   Date of procedure: 03/18/2024   Patient name: Debra Keller   MRN: [id number]  DOB: 09/17/1940   Surgeon: Ozell Ada, MD Assistant: none Pre-operative diagnosis: right bimalleolar ankle fracture Post-operative diagnosis: same as above Findings: displaced right bimalleolar ankle fracture   Specimens: none Anesthesia: regional and sedation EBL: 30cc Complications: none Pre-incision antibiotic: ancef  TXA was given prior to incision as well   Implants:  Implant Name Type Inv. Item Serial No. Manufacturer Lot No. LRB No. Used Action  PLATE FIBULA 9H RT - [id number] Plate PLATE FIBULA 9H RT  [employer]  Right 1 Implanted  SCREW CORT EVOS ST 3.5X12 - [id number] Screw SCREW CORT EVOS ST 3.5X12  [employer]  Right 3 Implanted  SCREW CORT 3.5X14 ST EVOS - [id number] Screw SCREW CORT 3.5X14 ST EVOS  [employer]  Right 1 Implanted  SCREW CORT 2.7X14 T8 EVOS - [id number] Screw SCREW CORT 2.7X14 T8 EVOS  [employer]  Right 1 Implanted  KIT INVISIKNOT ANKLE FRACTURE - [id number] Screw KIT INVISIKNOT ANKLE FRACTURE  [employer] [id number] Right 1 Implanted  SCREW LOCK 2.7X13 ST EVOS - [id number] Screw SCREW LOCK 2.7X13 ST EVOS  [employer]  Right 1 Implanted  SCREW CORT 2.7X12 EVOS - [id number] Screw SCREW CORT 2.7X12 EVOS  [employer]  Right 1 Implanted  SCREW EVOS 2.7X11 LOCK T8 - [id number] Screw SCREW EVOS 2.7X11 LOCK T8  [employer]  Right 1 Implanted       Indication for procedure: Patient is a 83 y.o. female who presented to the ER after a ground level fall. The patient had right ankle pain and x-rays revealed a right ankle fracture. The patient was admitted to a medicine  service with orthopedics consulted. I met the patient and discussed the fracture. I recommended operative management in the form of open reduction internal fixation to stabilize the fracture and promote fracture healing. Explained the risks of this procedure included, but were not limited to: nonunion, malunion, fixation failure/malposition, infection, bleeding, posttraumatic arthritis, stiffness, need for additional procedures, deep vein thrombosis, pulmonary embolism, MI, arrhythmia, and death. The alternatives of this surgery would be to treat the fracture with immobilization or to perform no intervention. After our discussion, patient elected to proceed with surgery.    Procedure Description: The patient was met in the pre-operative holding area. The patient's identity and consent were verified. The operative site was marked by myself. The patient's remaining questions about the surgery were answered. A block was performed by anesthesia. The patient was brought back to the operating room. The patient was transferred to the operating table in the supine position. Anesthesia was induced. All bony prominences were well padded. The surgical area was cleansed with alcohol. Ancef  and TXA were administered by anesthesia. The patient's skin was then prepped and draped in a standard, sterile fashion. A time out was performed that identified the patient, the procedure, and the operative site. All team members agreed with what was stated in the time out.    A longitudinal incision was made over the distal fibula and extended proximally.  The incision was taken sharply down through skin, dermis and to the level of the bone distally.  More proximally, the incision was taken sharply down through skin and dermis.  Blunt dissection was performed with scissors to get to the level of the bone.  The fracture fragments were seen within the wound.  Irrigation was used to remove hematoma around the fracture site.  There was  significant comminution at the fracture site.  I did not feel that I would be able to get a lag screw in any of the small fragments.  Decision was made to proceed with a bridge plate.  No further dissection around the fracture was performed to maintain the soft tissue attachments.  A plate was selected and placed over the lateral aspect of the fibula.  A towel clamp was placed around the distal fibula was used to apply traction.  A K wire was then placed through the plate and across the fibula and tibia to maintain the length.  The plate was then moved into a satisfactory position along the lateral aspect the fibula.  A drill was used to drill the fibula bicortically proximal to the fracture.  A depth gauge was used to estimate the screw length.  That length 3.5 mm cortical screw was then inserted until there was good purchase.  The same process was repeated to insert 2 more screws proximal to the fracture.  These were both cortical screws that were 3.5 mm in diameter.  Attention was then turned to the distal fixation.  A drill was used to drill the fibula bicortically at one of the holes distal to the fracture site.  A depth gauge was used to estimate the screw length.  That length 3.5 mm cortical screw was then inserted.  Next, attention was turned to the distal locking screws.  A total of 5 screws were placed.  These were 2.7 mm locking screws.  To place the screws, the cone guide was placed into the plate and the distal fibula was drilled and a cortically.  A depth gauge was used to estimate the screw length.  That length 2.7 mm locking screw was then inserted until it locked into the plate.  Once this fixation had been placed, the ankle joint was stressed with both a cotton test and a external rotation force manually.  There was gapping seen with the external rotation force and with medial clear space widening.  There was a small medial malleolus fracture.  The medial malleolus fracture was of such signs that I  do not think any good fixation would be able to be placed.  Decision was then made to proceed with suture button fixation.  A Beath pin was used to drill through one of the holes in the fibula across the tibia bicortically.  A stab incision was made over the distal medial tibia to allow the Beath pin to pass outside of the skin.  The suture button device was then passed across the fibula, syndesmosis, and distal tibia with the help of the Beath pin.  The button was then flipped on the distal medial aspect of the tibia.  The suture button device was tightened into place and several knots were thrown to secure it there.  Stress testing was again performed and no medial clear space widening was seen.   Final AP and lateral fluoroscopic images were then taken of the ankle showing satisfactory reduction of the fracture and placement of the fixation. The wounds were copiously irrigated with sterile saline.  Vancomycin  powder was placed in the wound.  The deep dermal layer was closed with 2-0 vicryl. The skin was closed with 2-0 nylon in a horizontal mattress  fashion. Dressings were applied.  Drapes were then taken down and a well-padded short leg splint was applied.  All counts were correct at the end of the case. Patient was transferred back to a hospital bed. The patient was awakened from anesthesia and brought back to the post-anesthesia care unit in stable condition.     Post-operative plan: The patient will recover in the post-anesthesia care unit and then go to the floor on the medicine service. The patient will receive two post-operative doses of ancef .  She will get another dose of TXA.  The patient will be nonweightbearing on the right lower extremity in a short leg splint.  ASA 81 mg twice daily will be used for DVT prophylaxis the patient will work with physical therapy. The patient's disposition will be determined by the medicine service.        Ozell Ada, MD Orthopedic Surgeon

## 2024-03-18 NOTE — Anesthesia Procedure Notes (Signed)
 Anesthesia Regional Block: Adductor canal block   Pre-Anesthetic Checklist: , timeout performed,  Correct Patient, Correct Site, Correct Laterality,  Correct Procedure, Correct Position, site marked,  Risks and benefits discussed,  Pre-op evaluation,  At surgeon's request and post-op pain management  Laterality: Right  Prep: Maximum Sterile Barrier Precautions used, chloraprep       Needles:  Injection technique: Single-shot  Needle Type: Echogenic Stimulator Needle     Needle Length: 9cm  Needle Gauge: 21     Additional Needles:   Procedures:,,,, ultrasound used (permanent image in chart),,    Narrative:  Start time: 03/18/2024 7:18 AM End time: 03/18/2024 7:20 AM Injection made incrementally with aspirations every 5 mL.  Performed by: Personally  Anesthesiologist: Epifanio Fallow, MD

## 2024-03-18 NOTE — Assessment & Plan Note (Addendum)
 HTN: Continue home metoprolol  100 mg daily, holding HCTZ pending a.m. BMP HLD: Resume atorvastatin  40 mg daily History of CVA: Resume ASA COPD: Continue Advair Hx breast cancer: Continue anastrozole  1 mg daily Osteoporosis: Holding all daughter right while admitted

## 2024-03-18 NOTE — Progress Notes (Signed)
 PT Cancellation Note  Patient Details Name: Debra Keller MRN: [id number] DOB: [birth year]   Cancelled Treatment:    Reason Eval/Treat Not Completed: Patient at procedure or test/unavailable. Acute PT to follow and re-attempt as schedule allows.   Kate ORN, PT, DPT Secure Chat Preferred  Rehab Office [phone]   Kate BRAVO Wendolyn 03/18/2024, 7:18 AM

## 2024-03-18 NOTE — Assessment & Plan Note (Addendum)
 S/p repletion.   - AM BMP - continue to hold hydrochlorothiazide

## 2024-03-18 NOTE — Transfer of Care (Signed)
 Immediate Anesthesia Transfer of Care Note  Patient: Debra Keller  Procedure(s) Performed: OPEN REDUCTION INTERNAL FIXATION (ORIF) ANKLE FRACTURE (Right: Ankle)  Patient Location: PACU  Anesthesia Type:General  Level of Consciousness: drowsy and patient cooperative  Airway & Oxygen Therapy: Patient Spontanous Breathing and Patient connected to face mask oxygen  Post-op Assessment: Report given to RN, Post -op Vital signs reviewed and stable, Patient moving all extremities, and Patient moving all extremities X 4  Post vital signs: Reviewed and stable  Last Vitals:  Vitals Value Taken Time  BP 124/72 03/18/24  09:43  Temp    Pulse 73 03/18/24 09:43  Resp 16 03/18/24 09:43  SpO2 100 % 03/18/24 09:43  Vitals shown include unfiled device data.  Last Pain:  Vitals:   03/18/24 0700  TempSrc: Oral  PainSc:          Complications: No notable events documented.

## 2024-03-19 ENCOUNTER — Encounter (HOSPITAL_COMMUNITY): Payer: Self-pay | Admitting: Orthopedic Surgery

## 2024-03-19 LAB — CBC
HCT: 34.8 % — ABNORMAL LOW (ref 36.0–46.0)
Hemoglobin: 11.7 g/dL — ABNORMAL LOW (ref 12.0–15.0)
MCH: 31.3 pg (ref 26.0–34.0)
MCHC: 33.6 g/dL (ref 30.0–36.0)
MCV: 93 fL (ref 80.0–100.0)
Platelets: 247 K/uL (ref 150–400)
RBC: 3.74 MIL/uL — ABNORMAL LOW (ref 3.87–5.11)
RDW: 13.4 % (ref 11.5–15.5)
WBC: 16 K/uL — ABNORMAL HIGH (ref 4.0–10.5)
nRBC: 0 % (ref 0.0–0.2)

## 2024-03-19 LAB — BASIC METABOLIC PANEL WITH GFR
Anion gap: 7 (ref 5–15)
BUN: 26 mg/dL — ABNORMAL HIGH (ref 8–23)
CO2: 30 mmol/L (ref 22–32)
Calcium: 8.7 mg/dL — ABNORMAL LOW (ref 8.9–10.3)
Chloride: 100 mmol/L (ref 98–111)
Creatinine, Ser: 1.05 mg/dL — ABNORMAL HIGH (ref 0.44–1.00)
GFR, Estimated: 53 mL/min — ABNORMAL LOW (ref >60)
Glucose, Bld: 109 mg/dL — ABNORMAL HIGH (ref 70–99)
Potassium: 4.3 mmol/L (ref 3.5–5.1)
Sodium: 137 mmol/L (ref 135–145)

## 2024-03-19 MED ORDER — ASPIRIN 81 MG PO CHEW
81.0000 mg | CHEWABLE_TABLET | Freq: Every day | ORAL | Status: DC
  Administered 2024-03-20: 10:00:00 81 mg via ORAL
  Filled 2024-03-19: qty 1

## 2024-03-19 MED ORDER — OYSTER SHELL CALCIUM/D3 500-5 MG-MCG PO TABS
2.0000 | ORAL_TABLET | Freq: Every day | ORAL | Status: DC
  Administered 2024-03-19 – 2024-03-20 (×2): 2 via ORAL
  Filled 2024-03-19 (×2): qty 2

## 2024-03-19 MED ORDER — ENOXAPARIN SODIUM 30 MG/0.3ML IJ SOSY
30.0000 mg | PREFILLED_SYRINGE | Freq: Every day | INTRAMUSCULAR | Status: DC
  Administered 2024-03-19 – 2024-03-20 (×2): 30 mg via SUBCUTANEOUS
  Filled 2024-03-19 (×2): qty 0.3

## 2024-03-19 NOTE — Assessment & Plan Note (Addendum)
 S/p ORIF, no pain, tolerating her diet. Change in WBC and h/h consistent w/ yesterday's surgery.  -Orthopedic surgery following, appreciate recs  - s/p ORIF 12/14  - NWB RLE  - Work with PT, appreciate their recs for dispo   - DVT Ppx: 81 mg BID post op - Pain control: Scheduled Tylenol  every 6 hours, oxycodone  5 mg every 4 hours,  - D/c Dilaudid  0.5 mg IV every 4 hours as needed for breakthrough pain - bowel reg: miralax  and senna daily  - AM BMP - This was likely an Osteoporotic pathological fracture of the ankle

## 2024-03-19 NOTE — Evaluation (Signed)
 Physical Therapy Evaluation Patient Details Name: Debra Keller MRN: [id number] DOB: 1941/02/03 Today's Date: 03/19/2024  History of Present Illness  83 y.o. female presents to [facility] 12/13 with right  ankle pain following mechanical fall. Right ankle x-ray found comminuted fibular fracture and ankle fracture with subluxation. S/p closed reduction in ED with interval improvement on x-ray. S/p ORIF R ankle 12/14. PMH: AAA, imbalance, breast cancer, bladder tumor, HLD, HTN, emphysema, Hx of stroke with residual L sided weakness  Clinical Impression  PTA, pt lives with her spouse in a house with 5 steps to enter and is modI for ambulation using a SPC. Pt presents with decreased functional mobility secondary to change in weightbearing precautions, weakness and decreased activity tolerance. Pt requiring min assist for hopping ~10 ft using RW; fair adherence to NWB RLE. Pt declining a manual wheelchair; reports it will not fit in the doorways of her house, although I reiterated with pt and pt spouse that this would likely be the safest way for her mobilize. Pt also has 5 steps to enter her home. Pt and pt spouse report between himself and other family members they can carry her into the house. Briefly provided visual demonstration of bumping up on bottom, as pt cannot clear her L foot enough to hop up at this time. Pt declining SNF. Will benefit from HHPT at d/c.      If plan is discharge home, recommend the following: A little help with walking and/or transfers;A little help with bathing/dressing/bathroom;Assistance with cooking/housework;Assist for transportation;Help with stairs or ramp for entrance;Supervision due to cognitive status   Can travel by private vehicle        Equipment Recommendations Rolling walker (2 wheels);BSC/3in1  Recommendations for Other Services       Functional Status Assessment Patient has had a recent decline in their functional status and demonstrates the  ability to make significant improvements in function in a reasonable and predictable amount of time.     Precautions / Restrictions Precautions Precautions: Fall Recall of Precautions/Restrictions: Impaired Required Braces or Orthoses: Splint/Cast Splint/Cast: short leg splint Splint/Cast - Date Prophylactic Dressing Applied (if applicable): 03/18/24 Restrictions Weight Bearing Restrictions Per Provider Order: Yes RLE Weight Bearing Per Provider Order: Non weight bearing      Mobility  Bed Mobility Overal bed mobility: Needs Assistance Bed Mobility: Supine to Sit, Sit to Supine     Supine to sit: Supervision Sit to supine: Supervision        Transfers Overall transfer level: Needs assistance Equipment used: Rolling walker (2 wheels) Transfers: Sit to/from Stand Sit to Stand: Contact guard assist                Ambulation/Gait Ambulation/Gait assistance: Min assist Gait Distance (Feet): 10 Feet Assistive device: Rolling walker (2 wheels) Gait Pattern/deviations: Step-to pattern Gait velocity: decreased Gait velocity interpretation: <1.31 ft/sec, indicative of household ambulator   General Gait Details: Hop to pattern around foot of bed, posterior LOB requiring minA for correction. Shuffling L foot  Stairs            Wheelchair Mobility     Tilt Bed    Modified Rankin (Stroke Patients Only)       Balance Overall balance assessment: Needs assistance Sitting-balance support: No upper extremity supported, Feet supported Sitting balance-Leahy Scale: Fair     Standing balance support: Bilateral upper extremity supported, Reliant on assistive device for balance, During functional activity Standing balance-Leahy Scale: Poor Standing balance comment: Unable to maintain standing balance  and adhere to NWB precautions without RW and external support.                             Pertinent Vitals/Pain Pain Assessment Pain Assessment:  No/denies pain    Home Living Family/patient expects to be discharged to:: Private residence Living Arrangements: Spouse/significant other Available Help at Discharge: Family;Available 24 hours/day (Daughter lives half mile away) Type of Home: House Home Access: Stairs to enter Entrance Stairs-Rails: Right Entrance Stairs-Number of Steps: 5 Alternate Level Stairs-Number of Steps: 3 Home Layout: Two level (split level) Home Equipment: Cane - single point      Prior Function Prior Level of Function : Independent/Modified Independent;History of Falls (last six months)             Mobility Comments: Uses SPC for mobility outside of home. Significant hx of falls ADLs Comments: Reports being independent     Extremity/Trunk Assessment   Upper Extremity Assessment Upper Extremity Assessment: Generalized weakness    Lower Extremity Assessment Lower Extremity Assessment: Generalized weakness    Cervical / Trunk Assessment Cervical / Trunk Assessment: Normal  Communication   Communication Communication: Impaired Factors Affecting Communication: Reduced clarity of speech    Cognition Arousal: Alert Behavior During Therapy: Impulsive   PT - Cognitive impairments: Memory, Problem solving, Awareness, Safety/Judgement                         Following commands: Impaired Following commands impaired: Follows one step commands inconsistently, Follows multi-step commands inconsistently     Cueing Cueing Techniques: Verbal cues, Visual cues, Tactile cues     General Comments      Exercises     Assessment/Plan    PT Assessment Patient needs continued PT services  PT Problem List Decreased strength;Decreased activity tolerance;Decreased balance;Decreased mobility;Decreased knowledge of precautions;Decreased cognition;Decreased safety awareness       PT Treatment Interventions DME instruction;Gait training;Functional mobility training;Therapeutic  activities;Therapeutic exercise;Balance training;Stair training;Patient/family education    PT Goals (Current goals can be found in the Care Plan section)  Acute Rehab PT Goals Patient Stated Goal: pt would like to go home PT Goal Formulation: With patient/family Time For Goal Achievement: 04/02/24 Potential to Achieve Goals: Fair    Frequency Min 2X/week     Co-evaluation               AM-PAC PT 6 Clicks Mobility  Outcome Measure Help needed turning from your back to your side while in a flat bed without using bedrails?: A Little Help needed moving from lying on your back to sitting on the side of a flat bed without using bedrails?: A Little Help needed moving to and from a bed to a chair (including a wheelchair)?: A Little Help needed standing up from a chair using your arms (e.g., wheelchair or bedside chair)?: A Little Help needed to walk in hospital room?: A Little Help needed climbing 3-5 steps with a railing? : Total 6 Click Score: 16    End of Session Equipment Utilized During Treatment: Gait belt Activity Tolerance: Patient tolerated treatment well Patient left: in bed;with call bell/phone within reach;with bed alarm set Nurse Communication: Mobility status;Other (comment) (sangineous drainage on dressing) PT Visit Diagnosis: Unsteadiness on feet (R26.81);Difficulty in walking, not elsewhere classified (R26.2);History of falling (Z91.81)    Time: 1535-1600 PT Time Calculation (min) (ACUTE ONLY): 25 min   Charges:   PT Evaluation $PT Eval Low  Complexity: 1 Low PT Treatments $Therapeutic Activity: 8-22 mins PT General Charges $$ ACUTE PT VISIT: 1 Visit         [name], PT, DPT Acute Rehabilitation Services Office [phone]   [name] 03/19/2024, 4:30 PM

## 2024-03-19 NOTE — Assessment & Plan Note (Addendum)
 HTN: hold home metoprolol  100 mg daily and hydrochlorothiazide for soft BP HLD: Resume atorvastatin  40 mg daily History of CVA: Resume ASA COPD: Continue Advair Hx breast cancer: Continue anastrozole  1 mg daily Osteoporosis: Start Calcium  and Vit D

## 2024-03-19 NOTE — Hospital Course (Signed)
 Debra Keller is a 83 y.o.female with a history of HTN, HLD, Hx CVA who was admitted to the family medicine Teaching Service at [facility] for displaced bimalleolar fracture of R ankle requiring ORIF. Her hospital course is detailed below:  R Ankle Fracture s/p ORIF Pt presented after mechanical fall at home. XR revealed comminuted fibular fracture and ankle fracture with subluxation of right ankle.  Head CT negative.  Initially underwent closed reduction in ED.  Ortho consulted, performed ORIF of right ankle on 12/14.  Patient pain was managed post-op and worked with PT. Pt was started on ASA 81 BID for 35 days days for DVT prophylaxis per Ortho. Patient to resume 81 mg ASA daily afterwards. Pt was discharged home with [facility]. Has followup outpt w/ Ortho 12/31.   HTN Patient had low blood pressures while admitted thus held home metoprolol  and HCTZ.   Other chronic conditions were medically managed with home medications and formulary alternatives as necessary  PCP Follow-up Recommendations: Ensure pt returns to Aspirin  daily dosing when she is done with DVT prophylaxis dosing. Orthopedics should be following up on this too.  Home metoprolol  and hydrochlorothiazide was held due to soft BP, restart as appropriate.

## 2024-03-19 NOTE — Evaluation (Signed)
 Occupational Therapy Evaluation Patient Details Name: Debra Keller MRN: [id number] DOB: 1940-12-18 Today's Date: 03/19/2024   History of Present Illness   83 y.o. female presents to [facility] 12/13 with right  ankle pain following mechanical fall. Right ankle x-ray found comminuted fibular fracture and ankle fracture with subluxation. S/p closed reduction in ED with interval improvement on x-ray. S/p ORIF R ankle 12/14. PMH: AAA, imbalance, breast cancer, bladder tumor, HLD, HTN, emphysema, Hx of stroke with residual L sided weakness     Clinical Impressions PTA Pt reports being Mod I with SPC for functional mobility and independent for ADL/IADL tasks. Pt currently requires up to Mod A for functional transfers and up to Mod A for ADL engagement. Pt is primarily limited by decreased safety awareness, generalized weakness, unsteadiness on feet, and decreased activity tolerance. OT to continue to follow Pt acutely to facilitate progress towards goals. Recommend HHOT services at d/c.      If plan is discharge home, recommend the following:   A lot of help with walking and/or transfers;A lot of help with bathing/dressing/bathroom;Assistance with cooking/housework;Direct supervision/assist for medications management;Assist for transportation;Help with stairs or ramp for entrance     Functional Status Assessment   Patient has had a recent decline in their functional status and demonstrates the ability to make significant improvements in function in a reasonable and predictable amount of time.     Equipment Recommendations   Wheelchair (measurements OT);Wheelchair cushion (measurements OT);BSC/3in1     Recommendations for Other Services         Precautions/Restrictions   Precautions Precautions: Fall Recall of Precautions/Restrictions: Impaired Required Braces or Orthoses: Splint/Cast Splint/Cast: short leg splint Splint/Cast - Date Prophylactic Dressing Applied (if  applicable): 03/18/24 Restrictions Weight Bearing Restrictions Per Provider Order: Yes RLE Weight Bearing Per Provider Order: Non weight bearing     Mobility Bed Mobility Overal bed mobility: Needs Assistance Bed Mobility: Supine to Sit, Sit to Supine     Supine to sit: Contact guard Sit to supine: Contact guard assist   General bed mobility comments: CGA and increased time for bed mobility.    Transfers Overall transfer level: Needs assistance Equipment used: Rolling walker (2 wheels) Transfers: Sit to/from Stand Sit to Stand: Mod assist           General transfer comment: Mod A to stand from bed while maintaining NWB precautions. Pt attempted step to hop and pivoting LLE for mobility but ultimately unable to maintain NWB precautions with Max multimodal cues. Pt was able to laterally scoot towards [facility] with therapist supporting RLE to maintain NWB.      Balance Overall balance assessment: Needs assistance Sitting-balance support: No upper extremity supported, Feet supported Sitting balance-Leahy Scale: Fair     Standing balance support: Bilateral upper extremity supported, Reliant on assistive device for balance, During functional activity Standing balance-Leahy Scale: Poor Standing balance comment: Unable to maintain standing balance and adhere to NWB precautions without RW and external support.                           ADL either performed or assessed with clinical judgement   ADL Overall ADL's : Needs assistance/impaired Eating/Feeding: Independent;Sitting   Grooming: Set up;Sitting   Upper Body Bathing: Minimal assistance;Sitting   Lower Body Bathing: Moderate assistance;Sitting/lateral leans   Upper Body Dressing : Set up;Sitting   Lower Body Dressing: Moderate assistance   Toilet Transfer: Moderate assistance;Stand-pivot;BSC/3in1;Rolling walker (2 wheels) Toilet Transfer Details (  indicate cue type and reason): Mod A for necessity of dense  mulitmodal cues to adhere to NWB precautions and to steady in standing. Toileting- Clothing Manipulation and Hygiene: Moderate assistance               Vision Patient Visual Report: Blurring of vision Vision Assessment?: Vision impaired- to be further tested in functional context     Perception         Praxis         Pertinent Vitals/Pain Pain Assessment Pain Assessment: No/denies pain     Extremity/Trunk Assessment Upper Extremity Assessment Upper Extremity Assessment: Generalized weakness   Lower Extremity Assessment Lower Extremity Assessment: Defer to PT evaluation   Cervical / Trunk Assessment Cervical / Trunk Assessment: Normal   Communication Communication Communication: Impaired Factors Affecting Communication: Reduced clarity of speech   Cognition Arousal: Alert Behavior During Therapy: Impulsive Cognition: Cognition impaired     Awareness: Online awareness impaired Memory impairment (select all impairments): Short-term memory, Working Biochemist, Clinical functioning impairment (select all impairments): Organization, Sequencing, Problem solving OT - Cognition Comments: Pt impulsive throughout session. AxO to self, time, and palce but demonstrates difficulty with executive functioning skills including organi                 Following commands: Impaired Following commands impaired: Follows one step commands inconsistently, Follows multi-step commands inconsistently     Cueing  General Comments   Cueing Techniques: Verbal cues;Visual cues;Tactile cues  VSS on RA   Exercises     Shoulder Instructions      Home Living Family/patient expects to be discharged to:: Private residence Living Arrangements: Spouse/significant other Available Help at Discharge: Family;Available 24 hours/day (Daughter lives half mile away) Type of Home: House Home Access: Stairs to enter Secretary/administrator of Steps: 5 Entrance Stairs-Rails: Right Home  Layout: Two level (split level) Alternate Level Stairs-Number of Steps: 3 Alternate Level Stairs-Rails: Right Bathroom Shower/Tub: Chief Strategy Officer: Standard Bathroom Accessibility: Yes How Accessible: Accessible via walker Home Equipment: Cane - single point          Prior Functioning/Environment Prior Level of Function : Independent/Modified Independent;History of Falls (last six months)             Mobility Comments: Uses SPC for mobility outside of home. Significant hx of falls ADLs Comments: Reports being independent    OT Problem List: Decreased strength;Decreased activity tolerance;Impaired balance (sitting and/or standing);Decreased safety awareness;Decreased knowledge of use of DME or AE;Decreased knowledge of precautions   OT Treatment/Interventions: Self-care/ADL training;Therapeutic exercise;Energy conservation;DME and/or AE instruction;Therapeutic activities;Patient/family education;Balance training      OT Goals(Current goals can be found in the care plan section)   Acute Rehab OT Goals Patient Stated Goal: to go home OT Goal Formulation: With patient Time For Goal Achievement: 04/02/24 Potential to Achieve Goals: Good ADL Goals Pt Will Perform Lower Body Dressing: with contact guard assist;sitting/lateral leans Pt Will Transfer to Toilet: with contact guard assist;with transfer board;bedside commode Pt Will Perform Toileting - Clothing Manipulation and hygiene: with supervision;sitting/lateral leans Pt Will Perform Tub/Shower Transfer: Tub transfer;with supervision;tub bench   OT Frequency:  Min 2X/week    Co-evaluation              AM-PAC OT 6 Clicks Daily Activity     Outcome Measure Help from another person eating meals?: None Help from another person taking care of personal grooming?: A Little Help from another person toileting, which includes using toliet,  bedpan, or urinal?: A Lot Help from another person bathing  (including washing, rinsing, drying)?: A Lot Help from another person to put on and taking off regular upper body clothing?: A Little Help from another person to put on and taking off regular lower body clothing?: A Lot 6 Click Score: 16   End of Session Equipment Utilized During Treatment: Gait belt;Rolling walker (2 wheels) Nurse Communication: Mobility status  Activity Tolerance: Patient tolerated treatment well Patient left: in bed;with call bell/phone within reach;with bed alarm set;with family/visitor present  OT Visit Diagnosis: Unsteadiness on feet (R26.81);Repeated falls (R29.6);Muscle weakness (generalized) (M62.81);History of falling (Z91.81)                Time: 8892-8865 OT Time Calculation (min): 27 min Charges:  OT General Charges $OT Visit: 1 Visit OT Evaluation $OT Eval Moderate Complexity: 1 Mod OT Treatments $Therapeutic Activity: 8-22 mins  Maurilio CROME, OTR/L.  [facility] Acute Rehabilitation  Office: [phone]   Maurilio PARAS Venetta Knee 03/19/2024, 1:24 PM

## 2024-03-19 NOTE — Progress Notes (Addendum)
° ° ° °  Daily Progress Note Intern Pager: [phone]  Patient name: Debra Keller [facility] Medical record number: [id number] Date of birth: 1941/02/22 Age: 83 y.o. Gender: female  Primary Care Provider: Valentin Skates, DO Consultants: Ortho Code Status: Full  Pt Overview and Major Events to Date:  12-13 admitted 12-14 ORIF R ankle  Medical Decision Making:  83 yo f s/p ORIF R ankle 12/14. Doing well post op, will follow ortho recs and dispo per PT/OT recs.  Assessment & Plan Displaced bimalleolar fracture of right ankle Closed nondisplaced comminuted fracture of shaft of right fibula S/p ORIF, no pain, tolerating her diet. Change in WBC and h/h consistent w/ yesterday's surgery.  -Orthopedic surgery following, appreciate recs  - s/p ORIF 12/14  - NWB RLE  - Work with PT, appreciate their recs for dispo   - DVT Ppx: 81 mg BID post op - Pain control: Scheduled Tylenol  every 6 hours, oxycodone  5 mg every 4 hours,  - D/c Dilaudid  0.5 mg IV every 4 hours as needed for breakthrough pain - bowel reg: miralax  and senna daily  - AM BMP - This was likely an Osteoporotic pathological fracture of the ankle  Chronic health problem HTN: hold home metoprolol  100 mg daily and hydrochlorothiazide for soft BP HLD: Resume atorvastatin  40 mg daily History of CVA: Resume ASA COPD: Continue Advair Hx breast cancer: Continue anastrozole  1 mg daily Osteoporosis: Start Calcium  and Vit D  FEN/GI: Carb mod PPx: lovenox  30mg  daily  Dispo:Home with home health pending clinical improvement .    Subjective:  Pt endorses no pain this AM. She endorses eating dinner and drinking fluids since surgery. No acute concerns this AM.   Objective: Temp:  [97.3 F (36.3 C)-98.2 F (36.8 C)] 98 F (36.7 C) (12/15 0759) Pulse Rate:  [59-88] 59 (12/15 0759) Resp:  [14-18] 18 (12/15 0759) BP: (95-130)/(47-64) 130/56 (12/15 0759) SpO2:  [90 %-100 %] 93 % (12/15 0759) Physical Exam: General: Awake, alert,  NAD. Communicates clearly. Cardio: RRR. Normal S1, S2. No murmur, rub, gallop. 2+ radial and dorsalis pedis pulses b/l w/ good capillary refill.  Resp: CTA bilaterally. No wheezes, rales, or rhonchi. Normal work of breathing on room air Abdomen: soft, non-tender, non-distended.  Extremities: Warm RLE w/ ankle wrapped and splinted. No surrounding TTP with intact movement of toes.    Laboratory: Most recent CBC Lab Results  Component Value Date   WBC 16.0 (H) 03/19/2024   HGB 11.7 (L) 03/19/2024   HCT 34.8 (L) 03/19/2024   MCV 93.0 03/19/2024   PLT 247 03/19/2024   Most recent BMP    Latest Ref Rng & Units 03/19/2024    6:05 AM  BMP  Glucose 70 - 99 mg/dL 890   BUN 8 - 23 mg/dL 26   Creatinine 9.55 - 1.00 mg/dL 8.94   Sodium 864 - 854 mmol/L 137   Potassium 3.5 - 5.1 mmol/L 4.3   Chloride 98 - 111 mmol/L 100   CO2 22 - 32 mmol/L 30   Calcium  8.9 - 10.3 mg/dL 8.7    None  Imaging/Diagnostic Tests:  R ankle xray Internal fixation of ankle fracture in near anatomic alignment.   Manon Jester, DO 03/19/2024, 8:29 AM  PGY-1, [facility] FPTS Intern pager: [phone], text pages welcome Secure chat group [facility] Teaching Service

## 2024-03-19 NOTE — Progress Notes (Signed)
 Orthopedic Plan of Care Note  Patient is now status post right ankle fracture open reduction internal fixation.  She should be nonweightbearing on the right lower extremity in a splint.  She can work with physical therapy.  She will get 2 postoperative doses of Ancef .  ASA 81 mg twice daily for DVT prophylaxis.  Disposition per primary.  Debra DELENA Ada, MD Orthopedic Surgeon

## 2024-03-19 NOTE — TOC CAGE-AID Note (Signed)
 Transition of Care [facility]) - CAGE-AID Screening   Patient Details  Name: Debra Keller MRN: [id number] Date of Birth: January 28, 1941  [facility] CM/SW Contact:    Jylan Loeza E Galdino Hinchman, LCSW Phone Number: 03/19/2024, 10:00 AM   Clinical Narrative: No SA noted.   CAGE-AID Screening:    Have You Ever Felt You Ought to Cut Down on Your Drinking or Drug Use?: No Have People Annoyed You By Critizing Your Drinking Or Drug Use?: No Have You Felt Bad Or Guilty About Your Drinking Or Drug Use?: No Have You Ever Had a Drink or Used Drugs First Thing In The Morning to Steady Your Nerves or to Get Rid of a Hangover?: No CAGE-AID Score: 0  Substance Abuse Education Offered: No

## 2024-03-20 ENCOUNTER — Other Ambulatory Visit (HOSPITAL_COMMUNITY): Payer: Self-pay

## 2024-03-20 LAB — CBC
HCT: 34.5 % — ABNORMAL LOW (ref 36.0–46.0)
Hemoglobin: 11.4 g/dL — ABNORMAL LOW (ref 12.0–15.0)
MCH: 30.8 pg (ref 26.0–34.0)
MCHC: 33 g/dL (ref 30.0–36.0)
MCV: 93.2 fL (ref 80.0–100.0)
Platelets: 256 K/uL (ref 150–400)
RBC: 3.7 MIL/uL — ABNORMAL LOW (ref 3.87–5.11)
RDW: 13.8 % (ref 11.5–15.5)
WBC: 11.5 K/uL — ABNORMAL HIGH (ref 4.0–10.5)
nRBC: 0 % (ref 0.0–0.2)

## 2024-03-20 LAB — BASIC METABOLIC PANEL WITH GFR
Anion gap: 5 (ref 5–15)
BUN: 23 mg/dL (ref 8–23)
CO2: 33 mmol/L — ABNORMAL HIGH (ref 22–32)
Calcium: 8.6 mg/dL — ABNORMAL LOW (ref 8.9–10.3)
Chloride: 104 mmol/L (ref 98–111)
Creatinine, Ser: 0.86 mg/dL (ref 0.44–1.00)
GFR, Estimated: >60 mL/min (ref >60)
Glucose, Bld: 79 mg/dL (ref 70–99)
Potassium: 4.5 mmol/L (ref 3.5–5.1)
Sodium: 142 mmol/L (ref 135–145)

## 2024-03-20 MED ORDER — POLYETHYLENE GLYCOL 3350 17 G PO PACK: 17.0000 g | PACK | Freq: Once | ORAL | Status: AC

## 2024-03-20 MED ORDER — OXYCODONE HCL 5 MG PO TABS
2.5000 mg | ORAL_TABLET | ORAL | 0 refills | Status: AC | PRN
  Filled 2024-03-20: qty 5, 1d supply, fill #0

## 2024-03-20 MED ORDER — ACETAMINOPHEN 500 MG PO TABS
1000.0000 mg | ORAL_TABLET | Freq: Three times a day (TID) | ORAL | 0 refills | Status: AC | PRN
  Filled 2024-03-20: qty 30, 5d supply, fill #0

## 2024-03-20 MED ORDER — ASPIRIN 81 MG PO CHEW
81.0000 mg | CHEWABLE_TABLET | Freq: Two times a day (BID) | ORAL | 0 refills | Status: AC
  Filled 2024-03-20: qty 70, 35d supply, fill #0

## 2024-03-20 NOTE — Progress Notes (Addendum)
 Discharge Nurse Summary: DC order noted per MD. DC RN at bedside with patient/family. Patient agreeable with discharge plan. AVS printed/reviewed. PIV removed, skin intact. DME delivered. No home meds. TOC meds delivered to the patient. CP/Edu resolved. Telemonitor not present on assessment. All belongings accounted for. Dressing to wound CDI w/o bleeding or drainage. See LDAs. Patient wheeled downstairs for discharge by private auto.   Rosario EMERSON Lund, RN

## 2024-03-20 NOTE — Progress Notes (Signed)
 Physical Therapy Treatment Patient Details Name: Debra Keller MRN: [id number] DOB: 1940/10/26 Today's Date: 03/20/2024   History of Present Illness 83 y.o. female presents [facility] 12/13 with right  ankle pain following mechanical fall. Right ankle x-ray found comminuted fibular fracture and ankle fracture with subluxation. S/p closed reduction in ED with interval improvement on x-ray. S/p ORIF R ankle 12/14. PMH: AAA, imbalance, breast cancer, bladder tumor, HLD, HTN, emphysema, Hx of stroke with residual L sided weakness    PT Comments  Pt eager to participate in physical therapy session. Session focused on transfer training and wheelchair mobility in preparation for d/c home. Pt requiring minA for squat pivot transfers and able to maintain weightbearing precautions. Dependent for wheelchair parts and management (I.e. locking brakes). Pt propelling w/c with BUE's a household distance with instructions provided. Provided a written handout on how to navigate steps with a w/c and +2 assist. Pt declining SNF; therefore, recommend HHPT.    If plan is discharge home, recommend the following: A little help with walking and/or transfers;A little help with bathing/dressing/bathroom;Assistance with cooking/housework;Assist for transportation;Help with stairs or ramp for entrance;Supervision due to cognitive status   Can travel by private vehicle        Equipment Recommendations  BSC/3in1;Wheelchair (measurements PT)    Recommendations for Other Services       Precautions / Restrictions Precautions Precautions: Fall Recall of Precautions/Restrictions: Impaired Required Braces or Orthoses: Splint/Cast Splint/Cast: short leg splint Splint/Cast - Date Prophylactic Dressing Applied (if applicable): 03/18/24 Restrictions Weight Bearing Restrictions Per Provider Order: Yes RLE Weight Bearing Per Provider Order: Non weight bearing     Mobility  Bed Mobility Overal bed mobility:  Modified Independent                  Transfers Overall transfer level: Needs assistance Equipment used: None Transfers: Bed to chair/wheelchair/BSC       Squat pivot transfers: Min assist     General transfer comment: MinA for squat pivot <> w/c. Needs reminders for locking w/c brakes    Ambulation/Gait                   Psychologist, Counselling mobility: Yes Wheelchair propulsion: Both upper extremities Wheelchair parts: Needs assistance Distance: 100 Wheelchair Assistance Details (indicate cue type and reason): Pt propelling w/c with BUE's over level surfaces, instructions for hand placement and performed right and left turns. MinA   Tilt Bed    Modified Rankin (Stroke Patients Only)       Balance Overall balance assessment: Needs assistance Sitting-balance support: No upper extremity supported, Feet supported Sitting balance-Leahy Scale: Fair                                      Hotel Manager: Impaired Factors Affecting Communication: Reduced clarity of speech  Cognition Arousal: Alert Behavior During Therapy: Impulsive   PT - Cognitive impairments: Memory, Problem solving, Awareness, Safety/Judgement                         Following commands: Impaired Following commands impaired: Follows one step commands inconsistently, Follows multi-step commands inconsistently    Cueing Cueing Techniques: Verbal cues, Visual cues, Tactile cues  Exercises      General Comments  Pertinent Vitals/Pain Pain Assessment Pain Assessment: No/denies pain    Home Living                          Prior Function            PT Goals (current goals can now be found in the care plan section) Acute Rehab PT Goals Patient Stated Goal: pt would like to go home PT Goal Formulation: With patient/family Time For Goal Achievement:  04/02/24 Potential to Achieve Goals: Fair Progress towards PT goals: Progressing toward goals    Frequency    Min 2X/week      PT Plan      Co-evaluation              AM-PAC PT 6 Clicks Mobility   Outcome Measure  Help needed turning from your back to your side while in a flat bed without using bedrails?: A Little Help needed moving from lying on your back to sitting on the side of a flat bed without using bedrails?: A Little Help needed moving to and from a bed to a chair (including a wheelchair)?: A Little Help needed standing up from a chair using your arms (e.g., wheelchair or bedside chair)?: A Little Help needed to walk in hospital room?: A Little Help needed climbing 3-5 steps with a railing? : Total 6 Click Score: 16    End of Session Equipment Utilized During Treatment: Gait belt Activity Tolerance: Patient tolerated treatment well Patient left: in bed;with call bell/phone within reach;with bed alarm set Nurse Communication: Mobility status PT Visit Diagnosis: Unsteadiness on feet (R26.81);Difficulty in walking, not elsewhere classified (R26.2);History of falling (Z91.81)     Time: 8971-8954 PT Time Calculation (min) (ACUTE ONLY): 17 min  Charges:    $Therapeutic Activity: 8-22 mins PT General Charges $$ ACUTE PT VISIT: 1 Visit                     [name], PT, DPT Acute Rehabilitation Services Office [phone]    [name] 03/20/2024, 10:51 AM

## 2024-03-20 NOTE — Discharge Summary (Addendum)
 Family Medicine Teaching [facility] Discharge Summary  Patient name: Debra Keller [facility] Medical record number: [id number] Date of birth: 06/03/1940 Age: 83 y.o. Gender: female Date of Admission: 03/17/2024  Date of Discharge: 03/20/2024  Admitting Physician: Suzann CHRISTELLA Daring, MD  Primary Care Provider: Valentin Skates, DO Consultants: Orthopedic surgery  Indication for Hospitalization: Displaced fracture right ankle  Discharge Diagnoses/Problem List:  Principal Problem for Admission: Displaced bimalleolar fracture of right ankle Other Problems addressed during stay:  Principal Problem:   Displaced bimalleolar fracture of right ankle Osteoporosis History of CVA   Brief Hospital Course:  Debra Keller is a 83 y.o.female with a history of HTN, HLD, Hx CVA who was admitted to the family medicine Teaching Service at [facility] for displaced bimalleolar fracture of R ankle requiring ORIF. Her hospital course is detailed below:  R Ankle Fracture s/p ORIF Pt presented after mechanical fall at home. XR revealed comminuted fibular fracture and ankle fracture with subluxation of right ankle.  Head CT negative.  Initially underwent closed reduction in ED.  Ortho consulted, performed ORIF of right ankle on 12/14.  Patient pain was managed post-op and worked with PT. Pt was started on ASA 81 BID for 35 days days for DVT prophylaxis per Ortho. Patient to resume 81 mg ASA daily afterwards. Pt was discharged home with [facility]. Has followup outpt w/ Ortho 12/31.   HTN Patient had low blood pressures while admitted thus held home metoprolol  and HCTZ.   Other chronic conditions were medically managed with home medications and formulary alternatives as necessary  PCP Follow-up Recommendations: Ensure pt returns to Aspirin  daily dosing when she is done with DVT prophylaxis dosing. Home metoprolol  and hydrochlorothiazide was held due to soft BP, restart as appropriate.  Repeat CBC and  BMP at follow up Consider alternative to bisphosphonate given recurrent fragility fracture      Results/Tests Pending at Time of Discharge:    Disposition: Home with home health  Discharge Condition: Stable  Discharge Exam:  Vitals:   03/20/24 0344 03/20/24 0730  BP: (!) 119/53 129/75  Pulse: 62 85  Resp: 17 16  Temp: 98.6 F (37 C) 98.6 F (37 C)  SpO2: 93% 93%   General: Awake, alert, NAD. Communicates clearly.  Cardio: RRR. Normal S1, S2. No murmur, rub, gallop. 2+ radial and dorsalis pedis pulses b/l w/ good capillary refill.  Resp: CTA bilaterally. No wheezes, rales, or rhonchi. Normal work of breathing on room air Abdomen: soft, non-tender, non-distended.  Extremities: Good movement of all toes of R foot, splint and wrappings in place, clean and dry. No TTP, erythema or swelling in the RLE.  Neuro: AOx4. No focal deficits, appropriately conversational.  Psych: Appropriate mood and affect.    Significant Procedures:  Right bimalleolar ankle fracture open reduction internal fixation 03/18/2024 Significant Labs and Imaging:  Recent Labs  Lab 03/19/24 0605 03/20/24 0349  WBC 16.0* 11.5*  HGB 11.7* 11.4*  HCT 34.8* 34.5*  PLT 247 256   Recent Labs  Lab 03/19/24 0605 03/20/24 0349  NA 137 142  K 4.3 4.5  CL 100 104  CO2 30 33*  GLUCOSE 109* 79  BUN 26* 23  CREATININE 1.05* 0.86  CALCIUM  8.7* 8.6*    Pertinent Imaging   Right ankle & foot 03/17/2024:  1. Comminuted distal fibular fracture above the level of the ankle mortise with associated medial malleolar fracture. 2. Widening of the anterior and medial tibiotalar joint space. 3. Cortical irregularity in the distal fourth  and fifth metatarsals, incompletely visualized. Correlation for pain/tenderness in this region recommended and dedicated foot x-rays could be used to further evaluate as clinically warranted.  1. Fracture subluxation at the ankle. Talus appears laterally subluxed relative to the  distal tibia. This displacement is more prominent than on the limited two view ankle film performed earlier today. 2. Old fractures of the fourth and fifth metatarsals    Right ankle 12/14 (s/p fixation) Internal fixation of ankle fracture in near anatomic alignment.   Discharge Medications:  Allergies as of 03/20/2024       Reactions   Penicillins Nausea Only   Morphine And Codeine Nausea Only        Medication List     PAUSE taking these medications    aspirin  EC 81 MG tablet Wait to take this until: April 25, 2024 Take 81 mg by mouth daily. You also have another medication with the same name that you may need to continue taking.   hydrochlorothiazide 25 MG tablet Wait to take this until your doctor or other care provider tells you to start again. Commonly known as: HYDRODIURIL Take 12.5 mg by mouth every morning.   metoprolol  succinate 100 MG 24 hr tablet Wait to take this until your doctor or other care provider tells you to start again. Commonly known as: TOPROL -XL Take 100 mg by mouth every morning.       TAKE these medications    Acetaminophen  Extra Strength 500 MG Tabs Take 2 tablets (1,000 mg total) by mouth every 8 (eight) hours as needed.   anastrozole  1 MG tablet Commonly known as: ARIMIDEX  TAKE 1 TABLET(1 MG) BY MOUTH DAILY   Aspirin  Low Dose 81 MG chewable tablet Generic drug: aspirin  Chew 1 tablet (81 mg total) by mouth in the morning and at bedtime. What changed: Another medication with the same name was paused. Ask your nurse or doctor if you should take this medication.   atorvastatin  40 MG tablet Commonly known as: LIPITOR Take 40 mg by mouth daily.   cholecalciferol  25 MCG (1000 UNIT) tablet Commonly known as: VITAMIN D3 1 tablet Orally Once a day for 30 day(s)   cyanocobalamin 500 MCG tablet Commonly known as: VITAMIN B12 Take 500 mcg by mouth daily.   Fluticasone -Salmeterol 250-50 MCG/DOSE Aepb Commonly known as:  ADVAIR Inhale 2 puffs into the lungs once.   Fosamax 70 MG tablet Generic drug: alendronate Take 1 tablet (70 mg total) by mouth once a week. Take with a full glass of water on an empty stomach.   oxyCODONE  5 MG immediate release tablet Commonly known as: Oxy IR/ROXICODONE  Take 0.5-1 tablets (2.5-5 mg total) by mouth every 4 (four) hours as needed for up to 5 doses for severe pain (pain score 7-10) or moderate pain (pain score 4-6) (first line).   polyethylene glycol 17 g packet Commonly known as: MIRALAX  / GLYCOLAX  Take 17 g by mouth once for 1 dose.               Durable Medical Equipment  (From admission, onward)           Start     Ordered   03/20/24 1125  For home use only DME lightweight manual wheelchair with seat cushion  Once       Comments: Patient suffers from  fibular fracture and ankle fracture  which impairs their ability to perform daily activities like bathing in the home.  A walker will not resolve  issue with performing activities  of daily living. A wheelchair will allow patient to safely perform daily activities. Patient is not able to propel themselves in the home using a Keller weight wheelchair due to general weakness. Patient can self propel in the lightweight wheelchair. Length of need 6 months . Accessories: elevating leg rests (ELRs), wheel locks, extensions and anti-tippers.   03/20/24 1127   03/20/24 0842  For home use only DME Bedside commode  Once       Question:  Patient needs a bedside commode to treat with the following condition  Answer:  Ankle fracture   03/20/24 0841   03/20/24 0841  For home use only DME Walker rolling  Once       Question Answer Comment  Walker: With 5 Inch Wheels   Patient needs a walker to treat with the following condition Ankle fracture      03/20/24 0841            Discharge Instructions: Please refer to Patient Instructions section of EMR for full details.  Patient was counseled important signs and  symptoms that should prompt return to medical care, changes in medications, dietary instructions, activity restrictions, and follow up appointments.   Follow-Up Appointments:  Future Appointments  Date Time Provider Department Center  04/03/2024 11:00 AM [facility] None  04/03/2024 11:30 AM Loretha Ash, MD [facility] None  04/04/2024  4:15 PM Georgina Ozell LABOR, MD [facility] None     Contact information for follow-up providers     Valentin Skates, DO Follow up.   Specialty: Internal Medicine Contact information: [address] [place] [address] [phone]              Contact information for after-discharge care     Home Medical Care     [facility]) .   Service: Home Health Services Why: home health PT and  OT services will be provided [facility], start of care within 48 hours post discharge Contact information: [address] [place]  [address] [phone]                     Lonnie Earnest, MD 03/20/2024, 2:29 PM PGY-2, [facility]

## 2024-03-20 NOTE — Care Management Important Message (Signed)
 Important Message  Patient Details  Name: Debra Keller MRN: [id number] Date of Birth: [birth year]   Important Message Given:  Yes - Medicare IM     [name] 03/20/2024, 12:46 PM

## 2024-03-20 NOTE — Plan of Care (Signed)
   Problem: Education: Goal: Knowledge of General Education information will improve Description: Including pain rating scale, medication(s)/side effects and non-pharmacologic comfort measures Outcome: Progressing   Problem: Clinical Measurements: Goal: Ability to maintain clinical measurements within normal limits will improve Outcome: Progressing   Problem: Activity: Goal: Risk for activity intolerance will decrease Outcome: Progressing   Problem: Nutrition: Goal: Adequate nutrition will be maintained Outcome: Progressing   Problem: Pain Managment: Goal: General experience of comfort will improve and/or be controlled Outcome: Progressing   Problem: Safety: Goal: Ability to remain free from injury will improve Outcome: Progressing

## 2024-03-20 NOTE — Plan of Care (Signed)

## 2024-03-20 NOTE — TOC Transition Note (Addendum)
 Transition of Care [facility]) - Discharge Note   Patient Details  Name: Debra Keller MRN: [id number] Date of Birth: [birth year]  Transition of Care [facility]) CM/SW Contact:  Rosalva Jon Bloch, RN Phone Number: 03/20/2024, 11:38 AM   Clinical Narrative:    Patient will DC to: home Anticipated DC date:03/20/2024 Family notified: yes Transport by: car    Pt with comminuted fibular fracture and ankle fracture       - S/p ORIF R ankle 12/14   Per MD patient ready for DC today. RN, patient, and patient's husband notified of DC. Husband and daughter to assist with needed care once home.  Home health and DME noted, pt agreeable. Pt with provider preference. [facility] accepted for home health services. DME : W/C, and BSC will be provided by [facility]. Declined RW.Equipment will be delivered to bedside prior to discharge. Husband to provide transportation to home. Post hospital f/u noted on AVS.  Pt without RX med concerns.  RNCM will sign off for now as intervention is no longer needed. Please consult us  again if new needs arise.    Final next level of care: Home w Home Health Services Barriers to Discharge: No Barriers Identified   Patient Goals and CMS Choice     Choice offered to / list presented to : Patient      Discharge Placement                       Discharge Plan and Services Additional resources added to the After Visit Summary for                  DME Arranged: Bedside commode, Wheelchair manual DME Agency: [employer] Date DME Agency Contacted: 03/20/24 Time DME Agency Contacted: 1136 Representative spoke with at DME Agency: [facility] HH Arranged: PT, OT [facility] Agency: [facility] Date [facility] Agency Contacted: 03/20/24 Time HH Agency Contacted: 1136 Representative spoke with at [facility] Agency: [facility]  Social Drivers of Health (SDOH) Interventions SDOH Screenings   Food Insecurity: No Food Insecurity (03/17/2024)  Housing: Low  Risk (03/17/2024)  Transportation Needs: No Transportation Needs (03/17/2024)  Utilities: Not At Risk (03/17/2024)  Social Connections: Unknown (03/17/2024)  Tobacco Use: High Risk (03/18/2024)     Readmission Risk Interventions     No data to display

## 2024-03-23 ENCOUNTER — Telehealth: Payer: Self-pay | Admitting: Orthopedic Surgery

## 2024-03-23 NOTE — Telephone Encounter (Signed)
 Received vm from pts husband, Zachary. He is needing clarification on the aspirin  dosage. Callback [phone]

## 2024-03-23 NOTE — Telephone Encounter (Signed)
 I called Debra Keller back and advised to take it BID

## 2024-04-02 ENCOUNTER — Other Ambulatory Visit: Payer: Self-pay | Admitting: *Deleted

## 2024-04-02 DIAGNOSIS — C50212 Malignant neoplasm of upper-inner quadrant of left female breast: Secondary | ICD-10-CM

## 2024-04-03 ENCOUNTER — Inpatient Hospital Stay: Payer: Medicare Other

## 2024-04-03 ENCOUNTER — Inpatient Hospital Stay: Payer: Medicare Other | Admitting: Hematology and Oncology

## 2024-04-04 ENCOUNTER — Ambulatory Visit: Admitting: Orthopedic Surgery

## 2024-04-04 ENCOUNTER — Other Ambulatory Visit

## 2024-04-04 ENCOUNTER — Telehealth: Payer: Self-pay | Admitting: Orthopedic Surgery

## 2024-04-04 DIAGNOSIS — S82841E Displaced bimalleolar fracture of right lower leg, subsequent encounter for open fracture type I or II with routine healing: Secondary | ICD-10-CM

## 2024-04-04 NOTE — Telephone Encounter (Signed)
 Pt was seen for post op today and need a 4 wk post op. Please call pt Friday and open slot. Pt number is [phone]

## 2024-04-04 NOTE — Progress Notes (Signed)
 Orthopedic Surgery Post-operative Office Visit  Procedure: right ankle fracture ORIF Date of Surgery: 03/18/2024 (~2 weeks post-op)  Assessment: Patient is a 83 y.o. who is doing as expected after surgery   Plan: -Operative plans complete -Sutures removed today in office -Okay to let soap/water run over incision but do not submerge -Non weight bearing right lower extremity in boot.  Can have boot off for showering and sleeping -Continue to work on ankle range of motion -Pain management: tylenol  as needed -Return to office in 4 weeks, x-rays needed at next visit: AP/lateral/mortise right ankle  ___________________________________________________________________________   Subjective: Patient has noticed improvement in her ankle pain since surgery.  She is now just using Tylenol  to control the pain.  She has been in a splint and nonweightbearing.  She has no complaints at today's visit.  Objective:  General: no acute distress, appropriate affect Neurologic: alert, answering questions appropriately, following commands Respiratory: unlabored breathing on room air Skin: incision is well approximated with no erythema, induration, active/expressible drainage  MSK (RLE): Ankle ROM from 0-25, EHL/TA/GSC intact, plantarflexes and dorsiflexes toes, sensation intact to light touch in sural/saphenous/deep peroneal/superficial peroneal/tibial nerve distributions, foot warm and well perfused  Imaging: X-rays of the right ankle from 04/04/2024 were independently reviewed and interpreted, showing lateral plate fixation over the distal fibula.  No lucency seen around the screws.  There is a suture button device spanning the syndesmosis and over the distal medial tibia.  Button appears in appropriate position.  There is a small minimally displaced medial malleolus fracture.  Slight valgus alignment to the talus.  No translation of the talus seen.  Fibula fracture appears well reduced.  No new fracture  seen.  No dislocation seen.   Patient name: Debra Keller [facility] Patient MRN: [id number] Date of visit: 04/04/2024

## 2024-04-06 NOTE — Telephone Encounter (Signed)
 Scheduled for 05/02/24  @ 1:45 pm

## 2024-05-02 ENCOUNTER — Encounter: Admitting: Orthopedic Surgery

## 2024-05-09 ENCOUNTER — Encounter: Admitting: Orthopedic Surgery

## 2024-05-14 ENCOUNTER — Encounter: Admitting: Orthopedic Surgery
